# Patient Record
Sex: Female | Born: 1980 | Race: Black or African American | Hispanic: No | Marital: Single | Smoking: Former smoker
Health system: Southern US, Community
[De-identification: ages and names within clinical notes are randomized; demographics above are authoritative.]

## PROBLEM LIST (undated history)

## (undated) DIAGNOSIS — O009 Unspecified ectopic pregnancy without intrauterine pregnancy: Secondary | ICD-10-CM

---

## 2013-06-03 ENCOUNTER — Encounter (HOSPITAL_COMMUNITY): Payer: Self-pay | Admitting: *Deleted

## 2013-06-03 ENCOUNTER — Emergency Department (HOSPITAL_COMMUNITY): Payer: Self-pay

## 2013-06-03 ENCOUNTER — Inpatient Hospital Stay (HOSPITAL_COMMUNITY)
Admission: EM | Admit: 2013-06-03 | Discharge: 2013-06-06 | DRG: 690 | Disposition: A | Discharge status: Home / Self Care | Payer: MEDICAID | Attending: Internal Medicine | Admitting: Internal Medicine

## 2013-06-03 DIAGNOSIS — D509 Iron deficiency anemia, unspecified: Secondary | ICD-10-CM | POA: Diagnosis present

## 2013-06-03 DIAGNOSIS — D500 Iron deficiency anemia secondary to blood loss (chronic): Secondary | ICD-10-CM | POA: Diagnosis present

## 2013-06-03 DIAGNOSIS — N643 Galactorrhea not associated with childbirth: Secondary | ICD-10-CM | POA: Diagnosis present

## 2013-06-03 DIAGNOSIS — I472 Ventricular tachycardia, unspecified: Secondary | ICD-10-CM | POA: Diagnosis present

## 2013-06-03 DIAGNOSIS — D649 Anemia, unspecified: Secondary | ICD-10-CM

## 2013-06-03 DIAGNOSIS — E0781 Sick-euthyroid syndrome: Secondary | ICD-10-CM | POA: Diagnosis present

## 2013-06-03 DIAGNOSIS — N12 Tubulo-interstitial nephritis, not specified as acute or chronic: Principal | ICD-10-CM | POA: Diagnosis present

## 2013-06-03 DIAGNOSIS — N920 Excessive and frequent menstruation with regular cycle: Secondary | ICD-10-CM | POA: Diagnosis present

## 2013-06-03 DIAGNOSIS — R5381 Other malaise: Secondary | ICD-10-CM | POA: Diagnosis present

## 2013-06-03 DIAGNOSIS — R350 Frequency of micturition: Secondary | ICD-10-CM | POA: Diagnosis present

## 2013-06-03 DIAGNOSIS — D62 Acute posthemorrhagic anemia: Secondary | ICD-10-CM | POA: Diagnosis present

## 2013-06-03 DIAGNOSIS — E876 Hypokalemia: Secondary | ICD-10-CM | POA: Diagnosis present

## 2013-06-03 DIAGNOSIS — R7989 Other specified abnormal findings of blood chemistry: Secondary | ICD-10-CM | POA: Diagnosis present

## 2013-06-03 DIAGNOSIS — I4729 Other ventricular tachycardia: Secondary | ICD-10-CM | POA: Diagnosis present

## 2013-06-03 DIAGNOSIS — Z87891 Personal history of nicotine dependence: Secondary | ICD-10-CM

## 2013-06-03 DIAGNOSIS — R112 Nausea with vomiting, unspecified: Secondary | ICD-10-CM | POA: Diagnosis present

## 2013-06-03 HISTORY — DX: Unspecified ectopic pregnancy without intrauterine pregnancy: O00.90

## 2013-06-03 LAB — CBC WITH DIFFERENTIAL/PLATELET
Basophils Relative: 0 % (ref 0–1)
Eosinophils Absolute: 0 10*3/uL (ref 0.0–0.7)
Eosinophils Relative: 0 % (ref 0–5)
HCT: 22.7 % — ABNORMAL LOW (ref 36.0–46.0)
Hemoglobin: 6.2 g/dL — CL (ref 12.0–15.0)
Lymphs Abs: 1 10*3/uL (ref 0.7–4.0)
MCH: 16.8 pg — ABNORMAL LOW (ref 26.0–34.0)
MCHC: 27.3 g/dL — ABNORMAL LOW (ref 30.0–36.0)
MCV: 61.5 fL — ABNORMAL LOW (ref 78.0–100.0)
Monocytes Absolute: 0.9 10*3/uL (ref 0.1–1.0)
Neutro Abs: 8.4 10*3/uL — ABNORMAL HIGH (ref 1.7–7.7)
RBC: 3.69 MIL/uL — ABNORMAL LOW (ref 3.87–5.11)

## 2013-06-03 LAB — COMPREHENSIVE METABOLIC PANEL
Albumin: 3.6 g/dL (ref 3.5–5.2)
Alkaline Phosphatase: 68 U/L (ref 39–117)
BUN: 7 mg/dL (ref 6–23)
CO2: 21 mEq/L (ref 19–32)
Chloride: 97 mEq/L (ref 96–112)
Creatinine, Ser: 0.57 mg/dL (ref 0.50–1.10)
GFR calc non Af Amer: >90 mL/min (ref >90)
Potassium: 3.2 mEq/L — ABNORMAL LOW (ref 3.5–5.1)
Total Bilirubin: 0.8 mg/dL (ref 0.3–1.2)

## 2013-06-03 LAB — URINALYSIS, ROUTINE W REFLEX MICROSCOPIC
Bilirubin Urine: NEGATIVE
Glucose, UA: NEGATIVE mg/dL
Ketones, ur: >80 mg/dL — AB
Nitrite: POSITIVE — AB
Protein, ur: 30 mg/dL — AB
pH: 7.5 (ref 5.0–8.0)

## 2013-06-03 LAB — PREPARE RBC (CROSSMATCH)

## 2013-06-03 LAB — RETICULOCYTES
RBC.: 3.43 MIL/uL — ABNORMAL LOW (ref 3.87–5.11)
Retic Ct Pct: 1.4 % (ref 0.4–3.1)

## 2013-06-03 LAB — LIPASE, BLOOD: Lipase: 14 U/L (ref 11–59)

## 2013-06-03 LAB — POCT PREGNANCY, URINE: Preg Test, Ur: NEGATIVE

## 2013-06-03 LAB — URINE MICROSCOPIC-ADD ON

## 2013-06-03 MED ORDER — FENTANYL CITRATE 0.05 MG/ML IJ SOLN: 50.0000 ug | Freq: Once | INTRAMUSCULAR | Status: DC

## 2013-06-03 MED ORDER — POTASSIUM CHLORIDE CRYS ER 20 MEQ PO TBCR
40.0000 meq | EXTENDED_RELEASE_TABLET | Freq: Once | ORAL | Status: AC
  Administered 2013-06-03: 40 meq via ORAL

## 2013-06-03 MED ORDER — ONDANSETRON HCL 4 MG PO TABS: 4.0000 mg | ORAL_TABLET | Freq: Four times a day (QID) | ORAL | Status: DC | PRN

## 2013-06-03 MED ORDER — ONDANSETRON HCL 4 MG/2ML IJ SOLN
4.0000 mg | Freq: Four times a day (QID) | INTRAMUSCULAR | Status: DC | PRN
  Administered 2013-06-04 – 2013-06-05 (×3): 4 mg via INTRAVENOUS
  Filled 2013-06-03 (×3): qty 2

## 2013-06-03 MED ORDER — SODIUM CHLORIDE 0.9 % IV SOLN
INTRAVENOUS | Status: DC
  Administered 2013-06-04: 02:00:00 via INTRAVENOUS

## 2013-06-03 MED ORDER — DEXTROSE 5 % IV SOLN
1.0000 g | Freq: Once | INTRAVENOUS | Status: AC
  Administered 2013-06-03: 1 g via INTRAVENOUS
  Filled 2013-06-03: qty 10

## 2013-06-03 MED ORDER — HYDROCODONE-ACETAMINOPHEN 5-325 MG PO TABS
1.0000 | ORAL_TABLET | ORAL | Status: DC | PRN
  Filled 2013-06-03 (×2): qty 2

## 2013-06-03 MED ORDER — DEXTROSE 5 % IV SOLN: 1.0000 g | INTRAVENOUS | Status: DC

## 2013-06-03 MED ORDER — ONDANSETRON HCL 4 MG/2ML IJ SOLN
4.0000 mg | Freq: Once | INTRAMUSCULAR | Status: AC
  Administered 2013-06-03: 4 mg via INTRAVENOUS
  Filled 2013-06-03: qty 2

## 2013-06-03 MED ORDER — SODIUM CHLORIDE 0.9 % IV BOLUS (SEPSIS)
1000.0000 mL | Freq: Once | INTRAVENOUS | Status: AC
  Administered 2013-06-03: 1000 mL via INTRAVENOUS

## 2013-06-03 MED ORDER — HYDROMORPHONE HCL PF 1 MG/ML IJ SOLN
1.0000 mg | INTRAMUSCULAR | Status: DC | PRN
  Administered 2013-06-03: 1 mg via INTRAVENOUS
  Filled 2013-06-03 (×2): qty 1

## 2013-06-03 MED ORDER — DEXTROSE 5 % IV SOLN
1.0000 g | INTRAVENOUS | Status: DC
  Administered 2013-06-04: 1 g via INTRAVENOUS
  Filled 2013-06-03 (×2): qty 10

## 2013-06-03 MED ORDER — IOHEXOL 300 MG/ML  SOLN
100.0000 mL | Freq: Once | INTRAMUSCULAR | Status: AC | PRN
  Administered 2013-06-03: 80 mL via INTRAVENOUS

## 2013-06-03 MED ORDER — SODIUM CHLORIDE 0.9 % IJ SOLN
3.0000 mL | Freq: Two times a day (BID) | INTRAMUSCULAR | Status: DC
  Administered 2013-06-03 – 2013-06-06 (×2): 3 mL via INTRAVENOUS

## 2013-06-03 MED ORDER — FENTANYL CITRATE 0.05 MG/ML IJ SOLN
50.0000 ug | Freq: Once | INTRAMUSCULAR | Status: AC
  Administered 2013-06-03: 50 ug via INTRAVENOUS
  Filled 2013-06-03: qty 2

## 2013-06-03 MED ORDER — IOHEXOL 300 MG/ML  SOLN
50.0000 mL | Freq: Once | INTRAMUSCULAR | Status: AC | PRN
  Administered 2013-06-03: 50 mL via INTRAVENOUS

## 2013-06-03 NOTE — H&P (Addendum)
[facility] History and Physical  Marcia Richardson ZOX:[id number] DOB: 08/21/1981 DOA: 06/03/2013  Referring physician: ED physician PCP: No primary provider on file.   Chief Complaint: abdominal pain   HPI:  Pt is 32 yo female with no significant PMH, presents to [facility] [place] with main concern of progressively worsening left side flank pain  That initially started 5 days prior to this admission, associated with dysuria and urinary urgency and frequency, subjective fevers, chills, poor oral intake, nausea and non bloody vomiting. Pt explains that pain is intermittent and colicky, 7/10 in severity when present, no similar events in the past. She reports pain radiates to entire abdominal area. Pt reports generalized weakness but says its chronic, no specific focal neurological symptoms.   In ED, CT abdomen and pelvis with left pyelonephritis, Hg 6.2 of unclear etiology. 2 units of PRBC ordered and pt given one dose of Rocephin. TRH asked to admit for further management.   Assessment and Plan:  Principal Problem:   Pyelonephritis - also evident on CT abdomen and pelvis - pt has received one dose of Rocephin in ED and will continue inpatient - will need to follow up on urine culture  - provide supportive care with analgesia and antiemetics as needed Active Problems:   Acute blood loss anemia - microcytic, unclear etiology - will check anemia panel, FOBT - agree with transfusing two units of PRBC - repeat CBC in AM   Hypokalemia - mild, will supplement and will check Mg level - BMP in AM  Code Status: Full Family Communication: Pt at bedside Disposition Plan: Admit to telemetry bed due to tachycardia   Review of Systems:  Constitutional: Positive for fever, chills and malaise/fatigue. Negative for diaphoresis.  HENT: Negative for hearing loss, ear pain, nosebleeds, congestion, sore throat, neck pain, tinnitus and ear discharge.   Eyes: Negative for blurred vision, double vision,  photophobia, pain, discharge and redness.  Respiratory: Negative for cough, hemoptysis, sputum production, shortness of breath, wheezing and stridor.   Cardiovascular: Negative for chest pain, palpitations, orthopnea, claudication and leg swelling.  Gastrointestinal: Positive for nausea, vomiting and abdominal pain. Negative for heartburn, constipation, blood in stool and melena.  Genitourinary: Positive for dysuria, urgency, frequency, flank pain.  Musculoskeletal: Negative for myalgias, back pain, joint pain and falls.  Skin: Negative for itching and rash.  Neurological: Negative for tingling, tremors, sensory change, speech change, focal weakness, loss of consciousness and headaches.  Endo/Heme/Allergies: Negative for environmental allergies and polydipsia. Does not bruise/bleed easily.  Psychiatric/Behavioral: Negative for suicidal ideas. The patient is not nervous/anxious.      Past Medical History  Diagnosis Date  . Ectopic pregnancy     2007    History reviewed. No pertinent past surgical history.  Social History:  reports that she quit smoking 6 days ago. She has never used smokeless tobacco. She reports that she does not drink alcohol or use illicit drugs.  No Known Allergies  No family history of cancers   Prior to Admission medications   Not on File    Physical Exam: Filed Vitals:   06/03/13 1844 06/03/13 1847 06/03/13 2116  BP:  119/60 112/63  Pulse:  104 92  Temp:  100.9 F (38.3 C)   TempSrc:  Oral   Resp:  16 15  SpO2: 99% 100% 99%    Physical Exam  Constitutional: Appears well-developed and well-nourished. No distress.  HENT: Normocephalic. External right and left ear normal. Oropharynx is clear and moist.  Eyes: Conjunctivae and  EOM are normal. PERRLA, no scleral icterus.  Neck: Normal ROM. Neck supple. No JVD. No tracheal deviation. No thyromegaly.  CVS: RRR, S1/S2 +, no murmurs, no gallops, no carotid bruit.  Pulmonary: Effort and breath sounds  normal, no stridor, rhonchi, wheezes, rales.  Abdominal: Soft. BS +,  no distension, tenderness in epigastrica area and left flank area, no rebound or guarding.  Musculoskeletal: Normal range of motion. No edema and no tenderness.  Lymphadenopathy: No lymphadenopathy noted, cervical, inguinal. Neuro: Alert. Normal reflexes, muscle tone coordination. No cranial nerve deficit. Skin: Skin is warm and dry. No rash noted. Not diaphoretic. No erythema. No pallor.  Psychiatric: Normal mood and affect. Behavior, judgment, thought content normal.   Labs on Admission:  Basic Metabolic Panel:  Recent Labs Lab 06/03/13 1909  NA 134*  K 3.2*  CL 97  CO2 21  GLUCOSE 88  BUN 7  CREATININE 0.57  CALCIUM 9.3   Liver Function Tests:  Recent Labs Lab 06/03/13 1909  AST 12  ALT 6  ALKPHOS 68  BILITOT 0.8  PROT 7.6  ALBUMIN 3.6    Recent Labs Lab 06/03/13 1909  LIPASE 14   No results found for this basename: AMMONIA,  in the last 168 hours CBC:  Recent Labs Lab 06/03/13 1909  WBC 10.3  NEUTROABS 8.4*  HGB 6.2*  HCT 22.7*  MCV 61.5*  PLT 258   Radiological Exams on Admission: Ct Abdomen Pelvis W Contrast 06/03/2013    1.  Focal areas of diminished perfusion in the left kidney suggestive of pyelonephritis.  No hydronephrosis or abscess is identified.  2.  Bilateral adnexal cysts without obvious evidence of rupture by CT.     EKG: Normal sinus rhythm, no ST/T wave changes  Debbora Presto, MD  [facility] Pager [phone]  If 7PM-7AM, please contact night-coverage [email or web address] Password [facility] 06/03/2013, 9:26 PM

## 2013-06-03 NOTE — ED Notes (Signed)
Pt reports abdominal pain x4 days, now radiates to back and down right leg. Vomited on Friday. Did not vomit en route. Pt has no medical hx except ectopic pregnancy 7 years.

## 2013-06-03 NOTE — Progress Notes (Signed)
Patient states she does not feel like signing up for My Chart at current time. Briscoe Burns BSN, RN-BC Admissions RN  06/03/2013 9:21 PM

## 2013-06-03 NOTE — ED Provider Notes (Signed)
CSN: [id number]     Arrival date & time 06/03/13  1844 History     First MD Initiated Contact with Patient 06/03/13 1922     Chief Complaint  Patient presents with  . Abdominal Pain  . Back Pain   (Consider location/radiation/quality/duration/timing/severity/associated sxs/prior Treatment) HPI Comments: Patient presents with left-sided abdominal pain that started about 5 days ago. She states started in her left lower back and now radiates across her abdomen. She's had some nausea and vomiting. She denies any known fevers. She denies any vaginal bleeding or discharge. She's had a history of an ectopic pregnancy in the past about 7 years ago. She states the pain feels similar to that. Spent a constant throbbing pain which gradually gotten worse. She does have some urinary frequency. She's also been feeling dizzy and may headache.    Past Medical History  Diagnosis Date  . Ectopic pregnancy     2007   History reviewed. No pertinent past surgical history. History reviewed. No pertinent family history. History  Substance Use Topics  . Smoking status: Not on file  . Smokeless tobacco: Not on file  . Alcohol Use: Not on file   OB History   Grav Para Term Preterm Abortions TAB SAB Ect Mult Living                 Review of Systems  Constitutional: Positive for fever and fatigue. Negative for chills and diaphoresis.  HENT: Negative for congestion, rhinorrhea and sneezing.   Eyes: Negative.   Respiratory: Negative for cough, chest tightness and shortness of breath.   Cardiovascular: Negative for chest pain and leg swelling.  Gastrointestinal: Positive for nausea, vomiting and abdominal pain. Negative for diarrhea and blood in stool.  Genitourinary: Positive for frequency. Negative for hematuria, flank pain, vaginal bleeding, vaginal discharge and difficulty urinating.  Musculoskeletal: Positive for back pain. Negative for arthralgias.  Skin: Negative for rash.  Neurological: Positive  for weakness (generalized). Negative for dizziness, speech difficulty, numbness and headaches.    Allergies  Review of patient's allergies indicates no known allergies.  Home Medications  No current outpatient prescriptions on file. BP 119/60  Pulse 104  Temp(Src) 100.9 F (38.3 C) (Oral)  Resp 16  SpO2 100%  LMP 04/21/2013 Physical Exam  Constitutional: She is oriented to person, place, and time. She appears well-developed and well-nourished.  HENT:  Head: Normocephalic and atraumatic.  Mouth/Throat: Oropharynx is clear and moist.  Eyes: Pupils are equal, round, and reactive to light.  Neck: Normal range of motion. Neck supple.  Cardiovascular: Normal rate, regular rhythm and normal heart sounds.   Pulmonary/Chest: Effort normal and breath sounds normal. No respiratory distress. She has no wheezes. She has no rales. She exhibits no tenderness.  Abdominal: Soft. Bowel sounds are normal. There is tenderness (Marked tenderness to the lower abdomen. More on the left side. She has voluntary guarding). There is no rebound and no guarding.  +CVA tenderness  Musculoskeletal: Normal range of motion. She exhibits no edema.  Lymphadenopathy:    She has no cervical adenopathy.  Neurological: She is alert and oriented to person, place, and time.  Skin: Skin is warm and dry. No rash noted.  Psychiatric: She has a normal mood and affect.    ED Course   Procedures (including critical care time)  Results for orders placed during the hospital encounter of 06/03/13  CBC WITH DIFFERENTIAL      Result Value Range   WBC 10.3  4.0 - 10.5  K/uL   RBC 3.69 (*) 3.87 - 5.11 MIL/uL   Hemoglobin 6.2 (*) 12.0 - 15.0 g/dL   HCT 16.1 (*) 09.6 - 04.5 %   MCV 61.5 (*) 78.0 - 100.0 fL   MCH 16.8 (*) 26.0 - 34.0 pg   MCHC 27.3 (*) 30.0 - 36.0 g/dL   RDW 40.9 (*) 81.1 - 91.4 %   Platelets 258  150 - 400 K/uL   Neutrophils Relative % 81 (*) 43 - 77 %   Lymphocytes Relative 10 (*) 12 - 46 %   Monocytes  Relative 9  3 - 12 %   Eosinophils Relative 0  0 - 5 %   Basophils Relative 0  0 - 1 %   Neutro Abs 8.4 (*) 1.7 - 7.7 K/uL   Lymphs Abs 1.0  0.7 - 4.0 K/uL   Monocytes Absolute 0.9  0.1 - 1.0 K/uL   Eosinophils Absolute 0.0  0.0 - 0.7 K/uL   Basophils Absolute 0.0  0.0 - 0.1 K/uL   RBC Morphology POLYCHROMASIA PRESENT     WBC Morphology MILD LEFT SHIFT (1-5% METAS, OCC MYELO, OCC BANDS)     Smear Review LARGE PLATELETS PRESENT    COMPREHENSIVE METABOLIC PANEL      Result Value Range   Sodium 134 (*) 135 - 145 mEq/L   Potassium 3.2 (*) 3.5 - 5.1 mEq/L   Chloride 97  96 - 112 mEq/L   CO2 21  19 - 32 mEq/L   Glucose, Bld 88  70 - 99 mg/dL   BUN 7  6 - 23 mg/dL   Creatinine, Ser 7.82  0.50 - 1.10 mg/dL   Calcium 9.3  8.4 - 95.6 mg/dL   Total Protein 7.6  6.0 - 8.3 g/dL   Albumin 3.6  3.5 - 5.2 g/dL   AST 12  0 - 37 U/L   ALT 6  0 - 35 U/L   Alkaline Phosphatase 68  39 - 117 U/L   Total Bilirubin 0.8  0.3 - 1.2 mg/dL   GFR calc non Af Amer >90  >90 mL/min   GFR calc Af Amer >90  >90 mL/min  LIPASE, BLOOD      Result Value Range   Lipase 14  11 - 59 U/L  URINALYSIS, ROUTINE W REFLEX MICROSCOPIC      Result Value Range   Color, Urine YELLOW  YELLOW   APPearance CLOUDY (*) CLEAR   Specific Gravity, Urine 1.019  1.005 - 1.030   pH 7.5  5.0 - 8.0   Glucose, UA NEGATIVE  NEGATIVE mg/dL   Hgb urine dipstick SMALL (*) NEGATIVE   Bilirubin Urine NEGATIVE  NEGATIVE   Ketones, ur >80 (*) NEGATIVE mg/dL   Protein, ur 30 (*) NEGATIVE mg/dL   Urobilinogen, UA 4.0 (*) 0.0 - 1.0 mg/dL   Nitrite POSITIVE (*) NEGATIVE   Leukocytes, UA LARGE (*) NEGATIVE  HCG, QUANTITATIVE, PREGNANCY      Result Value Range   hCG, Beta Chain, Quant, S <1  <5 mIU/mL  URINE MICROSCOPIC-ADD ON      Result Value Range   Squamous Epithelial / LPF FEW (*) RARE   WBC, UA 11-20  <3 WBC/hpf   RBC / HPF 0-2  <3 RBC/hpf   Bacteria, UA MANY (*) RARE   Urine-Other MUCOUS PRESENT    POCT PREGNANCY, URINE       Result Value Range   Preg Test, Ur NEGATIVE  NEGATIVE  TYPE AND SCREEN  Result Value Range   ABO/RH(D) O NEG     Antibody Screen NEG     Sample Expiration 06/06/2013     Unit Number [id number]     Blood Component Type RED CELLS,LR     Unit division 00     Status of Unit ALLOCATED     Transfusion Status OK TO TRANSFUSE     Crossmatch Result Compatible     Unit Number [id number]     Blood Component Type RED CELLS,LR     Unit division 00     Status of Unit ALLOCATED     Transfusion Status OK TO TRANSFUSE     Crossmatch Result Compatible    PREPARE RBC (CROSSMATCH)      Result Value Range   Order Confirmation ORDER PROCESSED BY BLOOD BANK    ABO/RH      Result Value Range   ABO/RH(D) O NEG     Ct Abdomen Pelvis W Contrast  06/03/2013   *RADIOLOGY REPORT*  Clinical Data: Lower quadrant abdominal pain and 18 units.  CT ABDOMEN AND PELVIS WITH CONTRAST  Technique:  Multidetector CT imaging of the abdomen and pelvis was performed following the standard protocol during bolus administration of intravenous contrast.  Contrast: 80mL OMNIPAQUE IOHEXOL 300 MG/ML  SOLN, 50mL OMNIPAQUE IOHEXOL 300 MG/ML  SOLN  Comparison: None.  Findings: The liver, gallbladder, pancreas, spleen and adrenal glands are within normal limits.  Within the left kidney, there are two focal areas of diminished perfusion identified at the anterior upper pole and the lateral mid kidney.  These are well appreciated on coronal reconstructions.  There may also be another subtle area of diminished perfusion in the lower pole of the left kidney. These findings may be reflective of pyelonephritis and correlation is suggested with urinalysis and location of pain.  No hydronephrosis, renal abscess or renal calculi are identified.  Bowel loops are normal caliber show no evidence of obstruction or inflammation.  A no free fluid, abnormal fluid collection or free intraperitoneal air is identified.  In the pelvis, bilateral adnexal  regions show prominent cyst.  On the right side, the unilocular cyst measures approximately 3.4 x 4.2 cm.  On the left side, single cyst measures 3.2 cm in greatest diameter.  No free fluid is seen in the pelvis.  The bladder is unremarkable.  No masses or enlarged lymph nodes are seen.  No vascular abnormalities are identified.  No evidence of hernia.  Bony structures are within normal limits.  IMPRESSION:  1.  Focal areas of diminished perfusion in the left kidney suggestive of pyelonephritis.  No hydronephrosis or abscess is identified.  Correlation suggested with urinalysis and location of the patient's pain. 2.  Bilateral adnexal cysts without obvious evidence of rupture by CT.   Original Report Authenticated By: Irish Lack, M.D.      Ct Abdomen Pelvis W Contrast  06/03/2013   *RADIOLOGY REPORT*  Clinical Data: Lower quadrant abdominal pain and 18 units.  CT ABDOMEN AND PELVIS WITH CONTRAST  Technique:  Multidetector CT imaging of the abdomen and pelvis was performed following the standard protocol during bolus administration of intravenous contrast.  Contrast: 80mL OMNIPAQUE IOHEXOL 300 MG/ML  SOLN, 50mL OMNIPAQUE IOHEXOL 300 MG/ML  SOLN  Comparison: None.  Findings: The liver, gallbladder, pancreas, spleen and adrenal glands are within normal limits.  Within the left kidney, there are two focal areas of diminished perfusion identified at the anterior upper pole and the lateral mid kidney.  These are  well appreciated on coronal reconstructions.  There may also be another subtle area of diminished perfusion in the lower pole of the left kidney. These findings may be reflective of pyelonephritis and correlation is suggested with urinalysis and location of pain.  No hydronephrosis, renal abscess or renal calculi are identified.  Bowel loops are normal caliber show no evidence of obstruction or inflammation.  A no free fluid, abnormal fluid collection or free intraperitoneal air is identified.  In the  pelvis, bilateral adnexal regions show prominent cyst.  On the right side, the unilocular cyst measures approximately 3.4 x 4.2 cm.  On the left side, single cyst measures 3.2 cm in greatest diameter.  No free fluid is seen in the pelvis.  The bladder is unremarkable.  No masses or enlarged lymph nodes are seen.  No vascular abnormalities are identified.  No evidence of hernia.  Bony structures are within normal limits.  IMPRESSION:  1.  Focal areas of diminished perfusion in the left kidney suggestive of pyelonephritis.  No hydronephrosis or abscess is identified.  Correlation suggested with urinalysis and location of the patient's pain. 2.  Bilateral adnexal cysts without obvious evidence of rupture by CT.   Original Report Authenticated By: Irish Lack, M.D.   1. Pyelonephritis   2. Anemia     MDM  Pt with marked abdominal tenderness. Initially I was concerned about a possible ruptured ectopic given her marked anemia. Her pregnancy test however his combat negative. She was given IV pain medication and fluids. Her CT scan shows a likely pyelonephritis which is consistent with exam. Given her marked anemia which is symptomatic I did go ahead and type and cross her for 2 units of packed red cells and I will consult hospitalist for admission. She was given Rocephin in the ED.     Rolan Bucco, MD 06/03/13 2100

## 2013-06-03 NOTE — ED Notes (Signed)
Pt states she has pain in her sides, back and chest since Friday. Pt states she used Icy-Hot to her sore areas and it helped. Pt states pain is sharp and constant. Rates pain 10/10. Pt a/o x 4. Pt with no acute distress. Skin warm and dry.

## 2013-06-04 ENCOUNTER — Inpatient Hospital Stay (HOSPITAL_COMMUNITY): Payer: MEDICAID

## 2013-06-04 DIAGNOSIS — N643 Galactorrhea not associated with childbirth: Secondary | ICD-10-CM | POA: Diagnosis present

## 2013-06-04 DIAGNOSIS — R7989 Other specified abnormal findings of blood chemistry: Secondary | ICD-10-CM | POA: Diagnosis present

## 2013-06-04 LAB — CBC
HCT: 27.7 % — ABNORMAL LOW (ref 36.0–46.0)
Hemoglobin: 8 g/dL — ABNORMAL LOW (ref 12.0–15.0)
MCH: 19.4 pg — ABNORMAL LOW (ref 26.0–34.0)
MCV: 67.1 fL — ABNORMAL LOW (ref 78.0–100.0)
RBC: 4.13 MIL/uL (ref 3.87–5.11)

## 2013-06-04 LAB — BASIC METABOLIC PANEL
CO2: 24 mEq/L (ref 19–32)
Glucose, Bld: 98 mg/dL (ref 70–99)
Potassium: 3.2 mEq/L — ABNORMAL LOW (ref 3.5–5.1)
Sodium: 133 mEq/L — ABNORMAL LOW (ref 135–145)

## 2013-06-04 LAB — TSH: TSH: 0.128 u[IU]/mL — ABNORMAL LOW (ref 0.350–4.500)

## 2013-06-04 LAB — PROLACTIN: Prolactin: 13.9 ng/mL

## 2013-06-04 LAB — T4, FREE: Free T4: 1.13 ng/dL (ref 0.80–1.80)

## 2013-06-04 LAB — IRON AND TIBC

## 2013-06-04 LAB — VITAMIN B12: Vitamin B-12: 515 pg/mL (ref 211–911)

## 2013-06-04 MED ORDER — HYDROMORPHONE HCL PF 2 MG/ML IJ SOLN
2.0000 mg | INTRAMUSCULAR | Status: DC | PRN
  Administered 2013-06-04: 2 mg via INTRAVENOUS
  Filled 2013-06-04: qty 1

## 2013-06-04 MED ORDER — KETOROLAC TROMETHAMINE 30 MG/ML IJ SOLN
30.0000 mg | Freq: Four times a day (QID) | INTRAMUSCULAR | Status: DC | PRN
  Administered 2013-06-06: 30 mg via INTRAVENOUS
  Filled 2013-06-04: qty 1

## 2013-06-04 MED ORDER — PROMETHAZINE HCL 25 MG/ML IJ SOLN: 25.0000 mg | INTRAMUSCULAR | Status: DC | PRN

## 2013-06-04 MED ORDER — VITAMINS A & D EX OINT
TOPICAL_OINTMENT | CUTANEOUS | Status: AC
  Administered 2013-06-04: 1
  Filled 2013-06-04: qty 5

## 2013-06-04 MED ORDER — HYDROMORPHONE HCL PF 1 MG/ML IJ SOLN
1.0000 mg | INTRAMUSCULAR | Status: DC | PRN
  Administered 2013-06-04 – 2013-06-05 (×4): 1 mg via INTRAVENOUS
  Filled 2013-06-04 (×4): qty 1

## 2013-06-04 MED ORDER — POTASSIUM CHLORIDE IN NACL 20-0.9 MEQ/L-% IV SOLN
INTRAVENOUS | Status: DC
  Administered 2013-06-04: 11:00:00 via INTRAVENOUS
  Administered 2013-06-05: 1000 mL via INTRAVENOUS
  Administered 2013-06-06: 08:00:00 via INTRAVENOUS
  Filled 2013-06-04 (×5): qty 1000

## 2013-06-04 MED ORDER — ACETAMINOPHEN 325 MG PO TABS
650.0000 mg | ORAL_TABLET | ORAL | Status: DC | PRN
  Administered 2013-06-04 (×2): 650 mg via ORAL
  Filled 2013-06-04 (×2): qty 2

## 2013-06-04 MED ORDER — HYDROMORPHONE HCL PF 1 MG/ML IJ SOLN
INTRAMUSCULAR | Status: AC
  Administered 2013-06-04: 2 mg
  Filled 2013-06-04: qty 1

## 2013-06-04 MED ORDER — BOOST / RESOURCE BREEZE PO LIQD
1.0000 | Freq: Two times a day (BID) | ORAL | Status: DC
  Administered 2013-06-04 – 2013-06-06 (×3): 1 via ORAL

## 2013-06-04 MED ORDER — ADULT MULTIVITAMIN W/MINERALS CH
1.0000 | ORAL_TABLET | Freq: Every day | ORAL | Status: DC
  Administered 2013-06-04 – 2013-06-06 (×3): 1 via ORAL
  Filled 2013-06-04 (×3): qty 1

## 2013-06-04 NOTE — Progress Notes (Signed)
Assumed care of patient. Condition stable. No change in previous RN  assessment at this time. Continue with plan of care

## 2013-06-04 NOTE — Progress Notes (Signed)
[facility] PROGRESS NOTE  Marcia Richardson AVW:[id number] DOB: December 15, 1980 DOA: 06/03/2013 PCP: No primary provider on file.  Brief narrative: Marcia Richardson is an 32 y.o. female with no significant PMH who was admitted on 06/03/2013 with pyelonephritis and incidentally discovered microcytic anemia with a presenting hemoglobin of 6.2 mg/dL.  Assessment/Plan: Principal Problem:   Pyelonephritis -Continue empiric Rocephin. -Followup urine culture results. -Continue supportive care with analgesia and antiemetics as needed. Active Problems:   Galactorrhea -Check prolactin level.  UPT done on admission negative.   Low TSH -Check free T4.   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 -Anemia profile consistent with iron deficiency. -Transvaginal ultrasound ordered.   Hypokalemia -Supplement. Magnesium levels okay.   Code Status: Full. Family Communication: No family at bedside. Disposition Plan: Home when stable.   Medical Consultants:  None.  Other Consultants:  None.  Anti-infectives: Rocephin 06/03/2013--->  HPI/Subjective: Marcia Richardson complains of bilateral milky nipple discharge for 1 week.  Not on any routine medicines at home.  Complains of back pain and nausea.  Endorses heavy periods, bleeds heavily and passes clots for 6 days each menstrual cycle.  Objective: Filed Vitals:   06/04/13 0404 06/04/13 0504 06/04/13 0604 06/04/13 0632  BP: 117/64 97/58 117/82 96/60  Pulse: 78 104 84 88  Temp: 97.9 F (36.6 C) 98.1 F (36.7 C) 97.7 F (36.5 C) 98.5 F (36.9 C)  TempSrc: Oral Oral Oral Oral  Resp: 18 18 18 20   Height:      Weight:      SpO2:        Intake/Output Summary (Last 24 hours) at 06/04/13 0750 Last data filed at 06/04/13 8295  Gross per 24 hour  Intake    425 ml  Output      0 ml  Net    425 ml    Exam: Gen:  Tearful. Cardiovascular:  RRR, No M/R/G Respiratory:   Lungs CTAB Gastrointestinal:  Abdomen soft, NT/ND, + BS Extremities:  No C/E/C  Data Reviewed: Basic Metabolic Panel:  Recent Labs Lab 06/03/13 1909 06/03/13 2145  NA 134*  --   K 3.2*  --   CL 97  --   CO2 21  --   GLUCOSE 88  --   BUN 7  --   CREATININE 0.57  --   CALCIUM 9.3  --   MG  --  2.0   GFR Estimated Creatinine Clearance: 68.2 ml/min (by C-G formula based on Cr of 0.57). Liver Function Tests:  Recent Labs Lab 06/03/13 1909  AST 12  ALT 6  ALKPHOS 68  BILITOT 0.8  PROT 7.6  ALBUMIN 3.6    Recent Labs Lab 06/03/13 1909  LIPASE 14   CBC:  Recent Labs Lab 06/03/13 1909  WBC 10.3  NEUTROABS 8.4*  HGB 6.2*  HCT 22.7*  MCV 61.5*  PLT 258   Thyroid function studies  Recent Labs  06/03/13 2145  TSH 0.128*   Anemia work up  Recent Labs  06/03/13 2145  VITAMINB12 515  FOLATE 13.7  FERRITIN 7*  TIBC Not calculated due to Iron <10.  IRON <10*  RETICCTPCT 1.4   Microbiology No results found for this or any previous visit (from the past 240 hour(s)).   Procedures and Diagnostic Studies: Ct Abdomen Pelvis W Contrast  06/03/2013   *RADIOLOGY REPORT*  Clinical Data: Lower quadrant abdominal pain and 18 units.  CT ABDOMEN AND PELVIS WITH CONTRAST  Technique:  Multidetector CT imaging of the abdomen and pelvis was performed following the standard protocol during bolus administration of intravenous contrast.  Contrast: 80mL OMNIPAQUE IOHEXOL 300 MG/ML  SOLN, 50mL OMNIPAQUE IOHEXOL 300 MG/ML  SOLN  Comparison: None.  Findings: The liver, gallbladder, pancreas, spleen and adrenal glands are within normal limits.  Within the left kidney, there are two focal areas of diminished perfusion identified at the anterior upper pole and the lateral mid kidney.  These are well appreciated on coronal reconstructions.  There may also be another subtle area of diminished perfusion in the lower pole of the left kidney. These findings may be reflective of  pyelonephritis and correlation is suggested with urinalysis and location of pain.  No hydronephrosis, renal abscess or renal calculi are identified.  Bowel loops are normal caliber show no evidence of obstruction or inflammation.  A no free fluid, abnormal fluid collection or free intraperitoneal air is identified.  In the pelvis, bilateral adnexal regions show prominent cyst.  On the right side, the unilocular cyst measures approximately 3.4 x 4.2 cm.  On the left side, single cyst measures 3.2 cm in greatest diameter.  No free fluid is seen in the pelvis.  The bladder is unremarkable.  No masses or enlarged lymph nodes are seen.  No vascular abnormalities are identified.  No evidence of hernia.  Bony structures are within normal limits.  IMPRESSION:  1.  Focal areas of diminished perfusion in the left kidney suggestive of pyelonephritis.  No hydronephrosis or abscess is identified.  Correlation suggested with urinalysis and location of the patient's pain. 2.  Bilateral adnexal cysts without obvious evidence of rupture by CT.   Original Report Authenticated By: Irish Lack, M.D.    Scheduled Meds: . cefTRIAXone (ROCEPHIN)  IV  1 g Intravenous Q24H  . sodium chloride  3 mL Intravenous Q12H   Continuous Infusions: . sodium chloride 50 mL/hr at 06/04/13 0228    Time spent: 35 minutes with > 50% of time spent ascertaining/clarifying PMH and HPI, discussing current diagnostic test results and clinical impression as well as plan of care.   LOS: 1 day   RAMA,CHRISTINA  [facility] Pager [phone].   *Please note that the hospitalists switch teams on Wednesdays. Please call the flow manager at [phone] if you are having difficulty reaching the hospitalist taking care of this patient as she can update you and provide the most up-to-date pager number of provider caring for the patient. If 8PM-8AM, please contact night-coverage at [email or web address], password [email or web address]  06/04/2013, 7:50 AM

## 2013-06-04 NOTE — Progress Notes (Signed)
INITIAL NUTRITION ASSESSMENT  DOCUMENTATION CODES Per approved criteria  -Not Applicable   INTERVENTION: Provide Resource Breeze BID until appetite improves Provide Multivitamin with minerals daily  NUTRITION DIAGNOSIS: Inadequate oral intake related to poor appetite/pain as evidenced by 10% wt loss in the past few months per pt's report.   Goal: Pt to meet >/= 90% of their estimated nutrition needs   Monitor:  PO intake Weight Labs  Reason for Assessment: Malnutrition Screening Tool, score of 3  32 y.o. female  Admitting Dx: Pyelonephritis  ASSESSMENT: 32 yo female with no significant PMH, presents to [facility] ED with main concern of progressively worsening left side flank pain That initially started 5 days prior to this admission, associated with dysuria and urinary urgency and frequency, subjective fevers, chills, poor oral intake, nausea and non bloody vomiting.   Pt reports that she hasn't been able to eat for the past 2 days but, she was eating well before. Pt states that today she has only had some clear liquids and jello and has vomited due to pain and nausea. Pt reports that she usually weighs 125 lbs which she weighed back in the Spring; she has lost weigh to due pain and decreased appetite.   Height: Ht Readings from Last 1 Encounters:  06/03/13 4\' 11"  (1.499 m)    Weight: Wt Readings from Last 1 Encounters:  06/03/13 112 lb 14 oz (51.2 kg)    Ideal Body Weight: 98 lbs  % Ideal Body Weight: 114%  Wt Readings from Last 10 Encounters:  06/03/13 112 lb 14 oz (51.2 kg)    Usual Body Weight: 125 lbs  % Usual Body Weight: 90%  BMI:  Body mass index is 22.79 kg/(m^2).  Estimated Nutritional Needs: Kcal: 1430-1640 Protein: 50-60 grams Fluid: >/= 1.9 L  Skin: WDL  Diet Order: General  EDUCATION NEEDS: -No education needs identified at this time   Intake/Output Summary (Last 24 hours) at 06/04/13 1627 Last data filed at 06/04/13 1422  Gross per 24  hour  Intake 847.92 ml  Output      1 ml  Net 846.92 ml    Last BM: 7/30   Labs:   Recent Labs Lab 06/03/13 1909 06/03/13 2145 06/04/13 0830  NA 134*  --  133*  K 3.2*  --  3.2*  CL 97  --  96  CO2 21  --  24  BUN 7  --  4*  CREATININE 0.57  --  0.56  CALCIUM 9.3  --  8.9  MG  --  2.0  --   GLUCOSE 88  --  98    CBG (last 3)  No results found for this basename: GLUCAP,  in the last 72 hours  Scheduled Meds: . cefTRIAXone (ROCEPHIN)  IV  1 g Intravenous Q24H  . sodium chloride  3 mL Intravenous Q12H    Continuous Infusions: . 0.9 % NaCl with KCl 20 mEq / L 50 mL/hr at 06/04/13 1039    Past Medical History  Diagnosis Date  . Ectopic pregnancy     2007    History reviewed. No pertinent past surgical history.  Ian Malkin RD, LDN Inpatient Clinical Dietitian Pager: [phone] After Hours Pager: [phone]

## 2013-06-04 NOTE — Progress Notes (Signed)
MD notified og temp 101.2 pre blood vital signs. Orders given. Will start blood 30 mmins post tylenol

## 2013-06-05 ENCOUNTER — Other Ambulatory Visit (HOSPITAL_COMMUNITY): Payer: Self-pay

## 2013-06-05 LAB — TYPE AND SCREEN
Antibody Screen: NEGATIVE
Unit division: 0

## 2013-06-05 LAB — URINE CULTURE: Colony Count: >=100000

## 2013-06-05 LAB — CBC
MCH: 21.1 pg — ABNORMAL LOW (ref 26.0–34.0)
MCHC: 30.5 g/dL (ref 30.0–36.0)
Platelets: 215 10*3/uL (ref 150–400)

## 2013-06-05 LAB — BASIC METABOLIC PANEL
BUN: <3 mg/dL — ABNORMAL LOW (ref 6–23)
CO2: 23 mEq/L (ref 19–32)
Calcium: 8.9 mg/dL (ref 8.4–10.5)
Chloride: 100 mEq/L (ref 96–112)
GFR calc non Af Amer: >90 mL/min (ref >90)
Glucose, Bld: 92 mg/dL (ref 70–99)
Potassium: 3.1 mEq/L — ABNORMAL LOW (ref 3.5–5.1)

## 2013-06-05 LAB — RAPID URINE DRUG SCREEN, HOSP PERFORMED
Amphetamines: NOT DETECTED
Barbiturates: NOT DETECTED
Benzodiazepines: NOT DETECTED
Tetrahydrocannabinol: POSITIVE — AB

## 2013-06-05 MED ORDER — VITAMINS A & D EX OINT
TOPICAL_OINTMENT | CUTANEOUS | Status: AC
  Administered 2013-06-05: 5
  Filled 2013-06-05: qty 5

## 2013-06-05 MED ORDER — CIPROFLOXACIN HCL 500 MG PO TABS
500.0000 mg | ORAL_TABLET | Freq: Two times a day (BID) | ORAL | Status: DC
  Administered 2013-06-05 – 2013-06-06 (×2): 500 mg via ORAL
  Filled 2013-06-05 (×5): qty 1

## 2013-06-05 MED ORDER — FERROUS SULFATE 325 (65 FE) MG PO TABS
325.0000 mg | ORAL_TABLET | Freq: Two times a day (BID) | ORAL | Status: DC
  Administered 2013-06-05 – 2013-06-06 (×2): 325 mg via ORAL
  Filled 2013-06-05 (×4): qty 1

## 2013-06-05 MED ORDER — POTASSIUM CHLORIDE CRYS ER 20 MEQ PO TBCR
20.0000 meq | EXTENDED_RELEASE_TABLET | Freq: Two times a day (BID) | ORAL | Status: DC
  Administered 2013-06-05 – 2013-06-06 (×3): 20 meq via ORAL
  Filled 2013-06-05 (×4): qty 1

## 2013-06-05 NOTE — Progress Notes (Signed)
Patient c/o chest pain.  Patient stated pain was sharp and worse with deep breaths.  Vital signs stable, heart rate adn rhythm stable on monitor, and patient displayed no symptoms of acute distress, no SOB, no diaphoresis.  EKG performed, patient slightly bradycardic but otherwise normal EKG.  Patient placed on 2L O2 via nasal cannula as a precaution, but O2 sats on room air were 100%.  Patient refused available pain medication (vicodin) to attempt to relieve pain in chest.  Dr. Darnelle Catalan notified; no new orders given.  Will continue to monitor patient.

## 2013-06-05 NOTE — Progress Notes (Signed)
Spoke with pt concerning discharge needs, medications and PCP. Explained [facility] to pt and [facility] program to pt. Pt states that she will not come down here to [place] and pay to see a doctor, in [place] she never pay to see a doctor, that she could get her friend to pay for her medications. Pt continued to say that she will go back home to [place].

## 2013-06-05 NOTE — Progress Notes (Signed)
Pt refused MRI scan today. Paged Dr. Darnelle Catalan. Dr. Darnelle Catalan said that if she was not able to do MRI due to claustrophobia , she may be able to do one outpatient that isn't so confined. MRI canceled.

## 2013-06-05 NOTE — Progress Notes (Signed)
[facility] PROGRESS NOTE  Marcia Richardson WUJ:[id number] DOB: 1980-12-01 DOA: 06/03/2013 PCP: No primary provider on file.  Brief narrative: Marcia Richardson is an 32 y.o. female with no significant PMH who was admitted on 06/03/2013 with pyelonephritis and incidentally discovered microcytic anemia with a presenting hemoglobin of 6.2 mg/dL. During the course of her hospital stay, the patient also complained of galactorrhea.  Assessment/Plan: Principal Problem:   Pyelonephritis -Treated with empiric Rocephin, organism sensitive. -Will switch antibiotics to oral Cipro and monitor another 24 hours to ensure tolerance of oral antibiotics and continued improvement. -Continue supportive care with analgesia and antiemetics as needed. Active Problems:   Galactorrhea -Prolactin levels within normal limits.  UPT done on admission negative. Likely physiologic but will need a mammogram and further evaluation at the breast Center. Would also recommend outpatient OB/GYN followup.   Low TSH -Free T4 is within normal limits. Suspect sick euthyroid syndrome.   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 -Anemia profile consistent with iron deficiency. Start iron replacement therapy. -Transvaginal ultrasound reviewed. No fibroids or abnormal endometrial tissue noted.   Hypokalemia -Supplementing. Magnesium levels okay.  Code Status: Full. Family Communication: Sister updated at bedside 06/04/2013. Disposition Plan: Home when stable.   Medical Consultants:  None.  Other Consultants:  None.  Anti-infectives: Rocephin 06/03/2013---> 06/05/2013 Cipro 06/05/2013--->  HPI/Subjective: Marcia Richardson is beginning to feel better. She has less back pain and less nausea. Her appetite is picking up. Continues to have nipple discharge with manipulation of the nipples.  Objective: Filed Vitals:   06/04/13 1305 06/04/13 1422 06/04/13 2208  06/05/13 0549  BP: 105/66 116/58 108/63 109/69  Pulse: 72 69 75 97  Temp: 97.6 F (36.4 C) 97.6 F (36.4 C) 98.6 F (37 C) 97.9 F (36.6 C)  TempSrc: Oral Oral Oral Oral  Resp: 18 18 18 18   Height:      Weight:      SpO2:   100% 100%    Intake/Output Summary (Last 24 hours) at 06/05/13 9562 Last data filed at 06/05/13 0629  Gross per 24 hour  Intake 1476.25 ml  Output    151 ml  Net 1325.25 ml    Exam: Gen:  Tearful. Breast exam: No palpable axillary lymphadenopathy or breast masses. Clear/milky nipple discharge with nipple manipulation. Eyes: Left peripheral visual defect. Cardiovascular:  RRR, No M/R/G Respiratory:  Lungs CTAB Gastrointestinal:  Abdomen soft, NT/ND, + BS Extremities:  No C/E/C  Data Reviewed: Basic Metabolic Panel:  Recent Labs Lab 06/03/13 1909 06/03/13 2145 06/04/13 0830 06/05/13 0521  NA 134*  --  133* 133*  K 3.2*  --  3.2* 3.1*  CL 97  --  96 100  CO2 21  --  24 23  GLUCOSE 88  --  98 92  BUN 7  --  4* <3*  CREATININE 0.57  --  0.56 0.50  CALCIUM 9.3  --  8.9 8.9  MG  --  2.0  --   --    GFR Estimated Creatinine Clearance: 68.2 ml/min (by C-G formula based on Cr of 0.5). Liver Function Tests:  Recent Labs Lab 06/03/13 1909  AST 12  ALT 6  ALKPHOS 68  BILITOT 0.8  PROT 7.6  ALBUMIN 3.6    Recent Labs Lab 06/03/13 1909  LIPASE 14   CBC:  Recent Labs Lab 06/03/13 1909 06/04/13 0830 06/05/13 0521  WBC 10.3 9.7 9.2  NEUTROABS 8.4*  --   --  HGB 6.2* 8.0* 9.1*  HCT 22.7* 27.7* 29.8*  MCV 61.5* 67.1* 69.1*  PLT 258 PLATELET CLUMPS NOTED ON SMEAR, COUNT APPEARS ADEQUATE 215   Thyroid function studies  Recent Labs  06/03/13 2145  TSH 0.128*   Anemia work up  Recent Labs  06/03/13 2145  VITAMINB12 515  FOLATE 13.7  FERRITIN 7*  TIBC Not calculated due to Iron <10.  IRON <10*  RETICCTPCT 1.4   Microbiology Recent Results (from the past 240 hour(s))  URINE CULTURE     Status: None   Collection Time     06/03/13  7:35 PM      Result Value Range Status   Specimen Description URINE, CLEAN CATCH   Final   Special Requests NONE   Final   Culture  Setup Time 06/04/2013 02:08   Final   Colony Count >=100,000 COLONIES/ML   Final   Culture ESCHERICHIA COLI   Final   Report Status PENDING   Incomplete     Procedures and Diagnostic Studies: Ct Abdomen Pelvis W Contrast  06/03/2013   *RADIOLOGY REPORT*  Clinical Data: Lower quadrant abdominal pain and 18 units.  CT ABDOMEN AND PELVIS WITH CONTRAST  Technique:  Multidetector CT imaging of the abdomen and pelvis was performed following the standard protocol during bolus administration of intravenous contrast.  Contrast: 80mL OMNIPAQUE IOHEXOL 300 MG/ML  SOLN, 50mL OMNIPAQUE IOHEXOL 300 MG/ML  SOLN  Comparison: None.  Findings: The liver, gallbladder, pancreas, spleen and adrenal glands are within normal limits.  Within the left kidney, there are two focal areas of diminished perfusion identified at the anterior upper pole and the lateral mid kidney.  These are well appreciated on coronal reconstructions.  There may also be another subtle area of diminished perfusion in the lower pole of the left kidney. These findings may be reflective of pyelonephritis and correlation is suggested with urinalysis and location of pain.  No hydronephrosis, renal abscess or renal calculi are identified.  Bowel loops are normal caliber show no evidence of obstruction or inflammation.  A no free fluid, abnormal fluid collection or free intraperitoneal air is identified.  In the pelvis, bilateral adnexal regions show prominent cyst.  On the right side, the unilocular cyst measures approximately 3.4 x 4.2 cm.  On the left side, single cyst measures 3.2 cm in greatest diameter.  No free fluid is seen in the pelvis.  The bladder is unremarkable.  No masses or enlarged lymph nodes are seen.  No vascular abnormalities are identified.  No evidence of hernia.  Bony structures are within  normal limits.  IMPRESSION:  1.  Focal areas of diminished perfusion in the left kidney suggestive of pyelonephritis.  No hydronephrosis or abscess is identified.  Correlation suggested with urinalysis and location of the patient's pain. 2.  Bilateral adnexal cysts without obvious evidence of rupture by CT.   Original Report Authenticated By: Irish Lack, M.D.   US Transvaginal Non-ob / US pelvis complete / [place] Art/ven Flow Abd Pelv Doppler  06/04/2013   *RADIOLOGY REPORT*  Clinical Data:  Pelvic pain and menorrhagia. Anemia.  TRANSABDOMINAL AND TRANSVAGINAL ULTRASOUND OF PELVIS AND DOPPLER ULTRASOUND OF OVARIES  Technique:  Both transabdominal and transvaginal ultrasound examinations of the pelvis were performed. Transabdominal technique was performed for global imaging of the pelvis including uterus, ovaries, adnexal regions, and pelvic cul-de-sac.  Color and duplex Doppler ultrasound was utilized to evaluate blood flow to the ovaries.  It was necessary to proceed with endovaginal exam following  the transabdominal exam to visualize the ovaries and uterus.  Comparison:  CT abdomen pelvis 06/03/2013  Findings:  Uterus: The uterus is retroflexed and measures 6.7 x 3.4 x 4.8 cm. Echotexture appears normal.  No focal uterine mass.  Endometrium: Measures 8 mm in maximal thickness.  Appears within normal limits.  Right ovary:  Measures 5.5 x 3.1 x 4.7 cm and contains a 4.3 x 2.5 x 3.7 cm simple cyst.  A few small follicles are seen within the periphery of the ovary.  Color Doppler flow is identified to the ovarian parenchyma, but is not seen within the cyst.  Venous and arterial waveforms are seen within the right ovarian parenchyma on Doppler imaging.  Left ovary: Measures 5.2 x 3.1 x 4.8 cm and contains a 4.2 x 2.2 x 3.6 cm cyst witha very thin internal septation.  A few tiny follicles are seen within the ovarian parenchyma.  Color Doppler flow is seen to the left ovary.  Arterial and venous waveforms are seen  within the left ovarian parenchyma on Doppler imaging.  Other findings: Small amount of simple free fluid.  IMPRESSION:  1.  Bilateral ovarian cysts (one within each ovary).  No evidence of ovarian torsion. This is almost certainly benign, and no specific imaging follow up is recommended according to the Society of Radiologists in Ultrasound 2010 Consensus  Conference Statement (D Lenis Noon et al. Management of Asymptomatic Ovarian and Other Adnexal Cysts Imaged at [place]:  Society of Radiologists in Ultrasound Consensus Conference Statement 2010.  Radiology 256 (Sept 2010): 943-954.).  2.  The uterus and endometrium are within normal limits.   Original Report Authenticated By: Britta Mccreedy, M.D.    Scheduled Meds: . cefTRIAXone (ROCEPHIN)  IV  1 g Intravenous Q24H  . feeding supplement  1 Container Oral BID BM  . multivitamin with minerals  1 tablet Oral Daily  . sodium chloride  3 mL Intravenous Q12H   Continuous Infusions: . 0.9 % NaCl with KCl 20 mEq / L 50 mL/hr at 06/04/13 2301    Time spent: 35 minutes with > 50% of time spent discussing current diagnostic test results, clinical impression, and plan of care.   LOS: 2 days   RAMA,CHRISTINA  [facility] Pager [phone].   *Please note that the hospitalists switch teams on Wednesdays. Please call the flow manager at [phone] if you are having difficulty reaching the hospitalist taking care of this patient as she can update you and provide the most up-to-date pager number of provider caring for the patient. If 8PM-8AM, please contact night-coverage at [email or web address], password [facility]  06/05/2013, 8:22 AM

## 2013-06-06 DIAGNOSIS — I472 Ventricular tachycardia: Secondary | ICD-10-CM | POA: Diagnosis not present

## 2013-06-06 LAB — BASIC METABOLIC PANEL
Calcium: 8.7 mg/dL (ref 8.4–10.5)
GFR calc non Af Amer: >90 mL/min (ref >90)
Sodium: 136 mEq/L (ref 135–145)

## 2013-06-06 MED ORDER — FERROUS SULFATE 325 (65 FE) MG PO TABS: 325.0000 mg | ORAL_TABLET | Freq: Two times a day (BID) | ORAL | Status: DC

## 2013-06-06 MED ORDER — CIPROFLOXACIN HCL 500 MG PO TABS: 500.0000 mg | ORAL_TABLET | Freq: Two times a day (BID) | ORAL | Status: DC

## 2013-06-06 NOTE — Care Management Note (Signed)
    Page 1 of 1   06/06/2013     4:46:58 PM   CARE MANAGEMENT NOTE 06/06/2013  Patient:  TENNYSON, KALLEN   Account Number:  [id number]  Date Initiated:  06/06/2013  Documentation initiated by:  Lanier Clam  Subjective/Objective Assessment:   ADMITTED W/PYELONEPHRITIS     Action/Plan:   FROM HOME.NO PCP.   Anticipated DC Date:  06/06/2013   Anticipated DC Plan:  HOME/SELF CARE      DC Planning Services  MATCH Program  [facility]  CM consult      Choice offered to / List presented to:             Status of service:  Completed, signed off Medicare Important Message given?   (If response is "NO", the following Medicare IM given date fields will be blank) Date Medicare IM given:   Date Additional Medicare IM given:    Discharge Disposition:  HOME/SELF CARE  Per UR Regulation:  Reviewed for med. necessity/level of care/duration of stay  If discussed at Long Length of Stay Meetings, dates discussed:    Comments:  06/06/13 Clarabelle Oscarson RN,BSN NCM WEEKEND [phone] MATCH PROGRAM UTILIZED FOR [facility].INFORMED PATIENT OF POLICY:1X USE WITHIN 12 CALENDAR MONTHS/$3 CO PAY-STATES SHE HAS/MUST FILL SCRIPT WITHIN 7 DAYS/NO NARCOTICS.PROVIDED W/FORM TO TAKE TO SELECT PHARMACIES LISTED W/SCRIPT.PATIENT VOICED UNDERSTANDING.PROVIDED W/COMMUNITY WELLNESS INFO FOR PCP APPT.COMMUNITY RESOURCES PROVIDED:HEALTH INSURANCE INFO,WEBSITE NEEDYMEDS,LOCAL CHURCHES.

## 2013-06-06 NOTE — Discharge Summary (Signed)
Physician Discharge Summary  Marcia Richardson ZOX:[id number] DOB: [birth year] DOA: 06/03/2013  PCP: No primary provider on file.   Referred to the [facility]  Admit date: 06/03/2013 Discharge date: 06/06/2013  Recommendations for Outpatient Follow-up:  1. The patient is being referred to the [facility] for outpatient followup. 2. Recommend a repeat check of her blood counts in one week. 3. Recommend outpatient referral to OB/GYN for further evaluation of menorrhagia and galactorrhea.  Note: Prolactin levels within normal limits during hospital stay. Patient unable to tolerate MRI. May need further evaluation with outpatient open MRI scanner if further evaluation deemed necessary. 4. Recommend outpatient mammogram.  Discharge Diagnoses:  Principal Problem:    Pyelonephritis Active Problems:    Microcytic anemia    Hypokalemia    Low TSH level    Galactorrhea    Nonsustained ventricular tachycardia   Discharge Condition:  Improved.  Diet recommendation: Regular.  History of present illness:  Marcia Richardson is an 32 y.o. female with no significant PMH who was admitted on 06/03/2013 with pyelonephritis and incidentally discovered microcytic anemia with a presenting hemoglobin of 6.2 mg/dL. During the course of her hospital stay, the patient also complained of galactorrhea.  Hospital Course by problem:  Principal Problem:  Pyelonephritis  -Treated with empiric Rocephin which was transitioned to oral Cipro based on sensitivity data.  -Provided with supportive care including IV fluids, antinausea medicines until symptoms resolved. Active Problems:  Galactorrhea  -Prolactin levels within normal limits. UPT done on admission negative. Likely physiologic but will need a mammogram and further evaluation at the breast Center. Would also recommend outpatient OB/GYN followup.  Low TSH  -Free T4 is within normal limits. Suspect sick  euthyroid syndrome.  Microcytic anemia secondary to chronic blood loss from menorrhagia  -Given 2 units of packed red blood cells.  -Anemia profile consistent with iron deficiency. Start iron replacement therapy.  -Transvaginal ultrasound reviewed. No fibroids or abnormal endometrial tissue noted.  Hypokalemia / Nonsustained ventricular tachycardia -Supplementing. Magnesium levels okay. -One 8 beat run of V. tach noted on telemetry when potassium was low. No further evaluation or workup felt to be necessary at this time.   Discharge Exam: Filed Vitals:   06/06/13 0627  BP: 100/51  Pulse: 55  Temp: 98.2 F (36.8 C)  Resp: 16   Filed Vitals:   06/05/13 0913 06/05/13 1300 06/05/13 2232 06/06/13 0627  BP: 136/73 107/67 125/63 100/51  Pulse: 68 70 58 55  Temp:  97.9 F (36.6 C) 97.3 F (36.3 C) 98.2 F (36.8 C)  TempSrc:  Oral Oral Oral  Resp: 12 15 16 16   Height:      Weight:      SpO2: 100% 100% 100% 95%    Gen:  NAD Cardiovascular:  RRR, No M/R/G Respiratory: Lungs CTAB Gastrointestinal: Abdomen soft, NT/ND with normal active bowel sounds. Extremities: No C/E/C   Discharge Instructions     Medication List         ciprofloxacin 500 MG tablet  Commonly known as:  CIPRO  Take 1 tablet (500 mg total) by mouth 2 (two) times daily.     ferrous sulfate 325 (65 FE) MG tablet  Take 1 tablet (325 mg total) by mouth 2 (two) times daily with a meal.          The results of significant diagnostics from this hospitalization (including imaging, microbiology, ancillary and laboratory) are listed below for reference.    Significant Diagnostic Studies:  US Transvaginal Non-ob  06/04/2013   *RADIOLOGY REPORT*  Clinical Data:  Pelvic pain and menorrhagia. Anemia.  TRANSABDOMINAL AND TRANSVAGINAL ULTRASOUND OF PELVIS AND DOPPLER ULTRASOUND OF OVARIES  Technique:  Both transabdominal and transvaginal ultrasound examinations of the pelvis were performed. Transabdominal technique  was performed for global imaging of the pelvis including uterus, ovaries, adnexal regions, and pelvic cul-de-sac.  Color and duplex Doppler ultrasound was utilized to evaluate blood flow to the ovaries.  It was necessary to proceed with endovaginal exam following the transabdominal exam to visualize the ovaries and uterus.  Comparison:  CT abdomen pelvis 06/03/2013  Findings:  Uterus: The uterus is retroflexed and measures 6.7 x 3.4 x 4.8 cm. Echotexture appears normal.  No focal uterine mass.  Endometrium: Measures 8 mm in maximal thickness.  Appears within normal limits.  Right ovary:  Measures 5.5 x 3.1 x 4.7 cm and contains a 4.3 x 2.5 x 3.7 cm simple cyst.  A few small follicles are seen within the periphery of the ovary.  Color Doppler flow is identified to the ovarian parenchyma, but is not seen within the cyst.  Venous and arterial waveforms are seen within the right ovarian parenchyma on Doppler imaging.  Left ovary: Measures 5.2 x 3.1 x 4.8 cm and contains a 4.2 x 2.2 x 3.6 cm cyst witha very thin internal septation.  A few tiny follicles are seen within the ovarian parenchyma.  Color Doppler flow is seen to the left ovary.  Arterial and venous waveforms are seen within the left ovarian parenchyma on Doppler imaging.  Other findings: Small amount of simple free fluid.  IMPRESSION:  1.  Bilateral ovarian cysts (one within each ovary).  No evidence of ovarian torsion. This is almost certainly benign, and no specific imaging follow up is recommended according to the Society of Radiologists in Ultrasound 2010 Consensus  Conference Statement (D Lenis Noon et al. Management of Asymptomatic Ovarian and Other Adnexal Cysts Imaged at [place]:  Society of Radiologists in Ultrasound Consensus Conference Statement 2010.  Radiology 256 (Sept 2010): 943-954.).  2.  The uterus and endometrium are within normal limits.   Original Report Authenticated By: Britta Mccreedy, M.D.   US Pelvis Complete  06/04/2013   *RADIOLOGY REPORT*   Clinical Data:  Pelvic pain and menorrhagia. Anemia.  TRANSABDOMINAL AND TRANSVAGINAL ULTRASOUND OF PELVIS AND DOPPLER ULTRASOUND OF OVARIES  Technique:  Both transabdominal and transvaginal ultrasound examinations of the pelvis were performed. Transabdominal technique was performed for global imaging of the pelvis including uterus, ovaries, adnexal regions, and pelvic cul-de-sac.  Color and duplex Doppler ultrasound was utilized to evaluate blood flow to the ovaries.  It was necessary to proceed with endovaginal exam following the transabdominal exam to visualize the ovaries and uterus.  Comparison:  CT abdomen pelvis 06/03/2013  Findings:  Uterus: The uterus is retroflexed and measures 6.7 x 3.4 x 4.8 cm. Echotexture appears normal.  No focal uterine mass.  Endometrium: Measures 8 mm in maximal thickness.  Appears within normal limits.  Right ovary:  Measures 5.5 x 3.1 x 4.7 cm and contains a 4.3 x 2.5 x 3.7 cm simple cyst.  A few small follicles are seen within the periphery of the ovary.  Color Doppler flow is identified to the ovarian parenchyma, but is not seen within the cyst.  Venous and arterial waveforms are seen within the right ovarian parenchyma on Doppler imaging.  Left ovary: Measures 5.2 x 3.1 x 4.8 cm and contains a 4.2 x 2.2 x  3.6 cm cyst witha very thin internal septation.  A few tiny follicles are seen within the ovarian parenchyma.  Color Doppler flow is seen to the left ovary.  Arterial and venous waveforms are seen within the left ovarian parenchyma on Doppler imaging.  Other findings: Small amount of simple free fluid.  IMPRESSION:  1.  Bilateral ovarian cysts (one within each ovary).  No evidence of ovarian torsion. This is almost certainly benign, and no specific imaging follow up is recommended according to the Society of Radiologists in Ultrasound 2010 Consensus  Conference Statement (D Lenis Noon et al. Management of Asymptomatic Ovarian and Other Adnexal Cysts Imaged at [place]:  Society of  Radiologists in Ultrasound Consensus Conference Statement 2010.  Radiology 256 (Sept 2010): 943-954.).  2.  The uterus and endometrium are within normal limits.   Original Report Authenticated By: Britta Mccreedy, M.D.   Ct Abdomen Pelvis W Contrast  06/03/2013   *RADIOLOGY REPORT*  Clinical Data: Lower quadrant abdominal pain and 18 units.  CT ABDOMEN AND PELVIS WITH CONTRAST  Technique:  Multidetector CT imaging of the abdomen and pelvis was performed following the standard protocol during bolus administration of intravenous contrast.  Contrast: 80mL OMNIPAQUE IOHEXOL 300 MG/ML  SOLN, 50mL OMNIPAQUE IOHEXOL 300 MG/ML  SOLN  Comparison: None.  Findings: The liver, gallbladder, pancreas, spleen and adrenal glands are within normal limits.  Within the left kidney, there are two focal areas of diminished perfusion identified at the anterior upper pole and the lateral mid kidney.  These are well appreciated on coronal reconstructions.  There may also be another subtle area of diminished perfusion in the lower pole of the left kidney. These findings may be reflective of pyelonephritis and correlation is suggested with urinalysis and location of pain.  No hydronephrosis, renal abscess or renal calculi are identified.  Bowel loops are normal caliber show no evidence of obstruction or inflammation.  A no free fluid, abnormal fluid collection or free intraperitoneal air is identified.  In the pelvis, bilateral adnexal regions show prominent cyst.  On the right side, the unilocular cyst measures approximately 3.4 x 4.2 cm.  On the left side, single cyst measures 3.2 cm in greatest diameter.  No free fluid is seen in the pelvis.  The bladder is unremarkable.  No masses or enlarged lymph nodes are seen.  No vascular abnormalities are identified.  No evidence of hernia.  Bony structures are within normal limits.  IMPRESSION:  1.  Focal areas of diminished perfusion in the left kidney suggestive of pyelonephritis.  No  hydronephrosis or abscess is identified.  Correlation suggested with urinalysis and location of the patient's pain. 2.  Bilateral adnexal cysts without obvious evidence of rupture by CT.   Original Report Authenticated By: Irish Lack, M.D.   [place] Art/ven Flow Abd Pelv Doppler  06/04/2013   *RADIOLOGY REPORT*  Clinical Data:  Pelvic pain and menorrhagia. Anemia.  TRANSABDOMINAL AND TRANSVAGINAL ULTRASOUND OF PELVIS AND DOPPLER ULTRASOUND OF OVARIES  Technique:  Both transabdominal and transvaginal ultrasound examinations of the pelvis were performed. Transabdominal technique was performed for global imaging of the pelvis including uterus, ovaries, adnexal regions, and pelvic cul-de-sac.  Color and duplex Doppler ultrasound was utilized to evaluate blood flow to the ovaries.  It was necessary to proceed with endovaginal exam following the transabdominal exam to visualize the ovaries and uterus.  Comparison:  CT abdomen pelvis 06/03/2013  Findings:  Uterus: The uterus is retroflexed and measures 6.7 x 3.4 x 4.8 cm. Echotexture  appears normal.  No focal uterine mass.  Endometrium: Measures 8 mm in maximal thickness.  Appears within normal limits.  Right ovary:  Measures 5.5 x 3.1 x 4.7 cm and contains a 4.3 x 2.5 x 3.7 cm simple cyst.  A few small follicles are seen within the periphery of the ovary.  Color Doppler flow is identified to the ovarian parenchyma, but is not seen within the cyst.  Venous and arterial waveforms are seen within the right ovarian parenchyma on Doppler imaging.  Left ovary: Measures 5.2 x 3.1 x 4.8 cm and contains a 4.2 x 2.2 x 3.6 cm cyst witha very thin internal septation.  A few tiny follicles are seen within the ovarian parenchyma.  Color Doppler flow is seen to the left ovary.  Arterial and venous waveforms are seen within the left ovarian parenchyma on Doppler imaging.  Other findings: Small amount of simple free fluid.  IMPRESSION:  1.  Bilateral ovarian cysts (one within each  ovary).  No evidence of ovarian torsion. This is almost certainly benign, and no specific imaging follow up is recommended according to the Society of Radiologists in Ultrasound 2010 Consensus  Conference Statement (D Lenis Noon et al. Management of Asymptomatic Ovarian and Other Adnexal Cysts Imaged at [place]:  Society of Radiologists in Ultrasound Consensus Conference Statement 2010.  Radiology 256 (Sept 2010): 943-954.).  2.  The uterus and endometrium are within normal limits.   Original Report Authenticated By: Britta Mccreedy, M.D.    Labs:  Basic Metabolic Panel:  Recent Labs Lab 06/03/13 1909 06/03/13 2145 06/04/13 0830 06/05/13 0521 06/06/13 0516  NA 134*  --  133* 133* 136  K 3.2*  --  3.2* 3.1* 3.6  CL 97  --  96 100 103  CO2 21  --  24 23 23   GLUCOSE 88  --  98 92 73  BUN 7  --  4* <3* 4*  CREATININE 0.57  --  0.56 0.50 0.55  CALCIUM 9.3  --  8.9 8.9 8.7  MG  --  2.0  --   --   --    GFR Estimated Creatinine Clearance: 68.2 ml/min (by C-G formula based on Cr of 0.55). Liver Function Tests:  Recent Labs Lab 06/03/13 1909  AST 12  ALT 6  ALKPHOS 68  BILITOT 0.8  PROT 7.6  ALBUMIN 3.6    Recent Labs Lab 06/03/13 1909  LIPASE 14   CBC:  Recent Labs Lab 06/03/13 1909 06/04/13 0830 06/05/13 0521  WBC 10.3 9.7 9.2  NEUTROABS 8.4*  --   --   HGB 6.2* 8.0* 9.1*  HCT 22.7* 27.7* 29.8*  MCV 61.5* 67.1* 69.1*  PLT 258 PLATELET CLUMPS NOTED ON SMEAR, COUNT APPEARS ADEQUATE 215   Thyroid function studies  Recent Labs  06/03/13 2145  TSH 0.128*    Ref. Range 06/04/2013 08:30  Free T4 Latest Range: 0.80-1.80 ng/dL 4.54   Anemia work up  Recent Labs  06/03/13 2145  VITAMINB12 515  FOLATE 13.7  FERRITIN 7*  TIBC Not calculated due to Iron <10.  IRON <10*  RETICCTPCT 1.4    Ref. Range 06/03/2013 19:29  hCG, Beta Chain, Quant, S Latest Range: <5 mIU/mL <1    Ref. Range 06/04/2013 08:00  Prolactin No range found 13.9   Microbiology Recent Results (from  the past 240 hour(s))  URINE CULTURE     Status: None   Collection Time    06/03/13  7:35 PM  Result Value Range Status   Specimen Description URINE, CLEAN CATCH   Final   Special Requests NONE   Final   Culture  Setup Time 06/04/2013 02:08   Final   Colony Count >=100,000 COLONIES/ML   Final   Culture ESCHERICHIA COLI   Final   Report Status 06/05/2013 FINAL   Final   Organism ID, Bacteria ESCHERICHIA COLI   Final    Time coordinating discharge: 25 minutes.  Signed:  RAMA,CHRISTINA  Pager [phone] [facility] 06/06/2013, 11:36 AM

## 2013-06-18 ENCOUNTER — Ambulatory Visit: Payer: Self-pay

## 2013-08-11 ENCOUNTER — Inpatient Hospital Stay (HOSPITAL_COMMUNITY): Payer: Self-pay

## 2013-08-11 ENCOUNTER — Inpatient Hospital Stay (HOSPITAL_COMMUNITY)
Admission: EM | Admit: 2013-08-11 | Discharge: 2013-08-13 | DRG: 872 | Disposition: A | Discharge status: Home / Self Care | Payer: Self-pay | Attending: Internal Medicine | Admitting: Internal Medicine

## 2013-08-11 ENCOUNTER — Encounter (HOSPITAL_COMMUNITY): Payer: Self-pay | Admitting: *Deleted

## 2013-08-11 ENCOUNTER — Inpatient Hospital Stay (HOSPITAL_COMMUNITY): Payer: MEDICAID

## 2013-08-11 DIAGNOSIS — I959 Hypotension, unspecified: Secondary | ICD-10-CM | POA: Diagnosis present

## 2013-08-11 DIAGNOSIS — N8300 Follicular cyst of ovary, unspecified side: Secondary | ICD-10-CM | POA: Diagnosis present

## 2013-08-11 DIAGNOSIS — G43909 Migraine, unspecified, not intractable, without status migrainosus: Secondary | ICD-10-CM | POA: Diagnosis present

## 2013-08-11 DIAGNOSIS — N10 Acute tubulo-interstitial nephritis: Secondary | ICD-10-CM | POA: Diagnosis present

## 2013-08-11 DIAGNOSIS — Z87891 Personal history of nicotine dependence: Secondary | ICD-10-CM

## 2013-08-11 DIAGNOSIS — D509 Iron deficiency anemia, unspecified: Secondary | ICD-10-CM | POA: Diagnosis present

## 2013-08-11 DIAGNOSIS — D72829 Elevated white blood cell count, unspecified: Secondary | ICD-10-CM | POA: Diagnosis present

## 2013-08-11 DIAGNOSIS — E871 Hypo-osmolality and hyponatremia: Secondary | ICD-10-CM | POA: Diagnosis present

## 2013-08-11 DIAGNOSIS — N920 Excessive and frequent menstruation with regular cycle: Secondary | ICD-10-CM | POA: Diagnosis present

## 2013-08-11 DIAGNOSIS — E876 Hypokalemia: Secondary | ICD-10-CM | POA: Diagnosis present

## 2013-08-11 DIAGNOSIS — D649 Anemia, unspecified: Secondary | ICD-10-CM | POA: Diagnosis present

## 2013-08-11 DIAGNOSIS — A419 Sepsis, unspecified organism: Principal | ICD-10-CM | POA: Diagnosis present

## 2013-08-11 DIAGNOSIS — N12 Tubulo-interstitial nephritis, not specified as acute or chronic: Secondary | ICD-10-CM | POA: Diagnosis present

## 2013-08-11 LAB — COMPREHENSIVE METABOLIC PANEL
ALT: 9 U/L (ref 0–35)
ALT: 8 U/L (ref 0–35)
AST: 14 U/L (ref 0–37)
AST: 14 U/L (ref 0–37)
Alkaline Phosphatase: 71 U/L (ref 39–117)
BUN: 7 mg/dL (ref 6–23)
CO2: 20 mEq/L (ref 19–32)
CO2: 23 mEq/L (ref 19–32)
Calcium: 9.2 mg/dL (ref 8.4–10.5)
Chloride: 94 mEq/L — ABNORMAL LOW (ref 96–112)
Creatinine, Ser: 0.69 mg/dL (ref 0.50–1.10)
GFR calc Af Amer: >90 mL/min (ref >90)
GFR calc non Af Amer: >90 mL/min (ref >90)
GFR calc non Af Amer: >90 mL/min (ref >90)
Glucose, Bld: 81 mg/dL (ref 70–99)
Potassium: 3.4 mEq/L — ABNORMAL LOW (ref 3.5–5.1)
Sodium: 130 mEq/L — ABNORMAL LOW (ref 135–145)
Total Bilirubin: 1.1 mg/dL (ref 0.3–1.2)
Total Protein: 7.9 g/dL (ref 6.0–8.3)

## 2013-08-11 LAB — CBC WITH DIFFERENTIAL/PLATELET
Basophils Absolute: 0 10*3/uL (ref 0.0–0.1)
Basophils Absolute: 0 10*3/uL (ref 0.0–0.1)
Basophils Relative: 0 % (ref 0–1)
Eosinophils Absolute: 0 10*3/uL (ref 0.0–0.7)
Eosinophils Relative: 0 % (ref 0–5)
HCT: 34.1 % — ABNORMAL LOW (ref 36.0–46.0)
HCT: 32.1 % — ABNORMAL LOW (ref 36.0–46.0)
Hemoglobin: 11.3 g/dL — ABNORMAL LOW (ref 12.0–15.0)
Hemoglobin: 10.4 g/dL — ABNORMAL LOW (ref 12.0–15.0)
Lymphocytes Relative: 15 % (ref 12–46)
MCH: 25.4 pg — ABNORMAL LOW (ref 26.0–34.0)
MCHC: 32.4 g/dL (ref 30.0–36.0)
Monocytes Absolute: 1.6 10*3/uL — ABNORMAL HIGH (ref 0.1–1.0)
Monocytes Absolute: 1.5 10*3/uL — ABNORMAL HIGH (ref 0.1–1.0)
Monocytes Relative: 12 % (ref 3–12)
Neutro Abs: 10.2 10*3/uL — ABNORMAL HIGH (ref 1.7–7.7)
RBC: 4.39 MIL/uL (ref 3.87–5.11)
RDW: 19.5 % — ABNORMAL HIGH (ref 11.5–15.5)
RDW: 19.7 % — ABNORMAL HIGH (ref 11.5–15.5)
WBC: 14 10*3/uL — ABNORMAL HIGH (ref 4.0–10.5)

## 2013-08-11 LAB — MRSA PCR SCREENING: MRSA by PCR: NEGATIVE

## 2013-08-11 LAB — ETHANOL
Alcohol, Ethyl (B): <11 mg/dL (ref 0–11)
Alcohol, Ethyl (B): <11 mg/dL (ref 0–11)

## 2013-08-11 LAB — URINALYSIS, ROUTINE W REFLEX MICROSCOPIC
Bilirubin Urine: NEGATIVE
Ketones, ur: 40 mg/dL — AB
Protein, ur: 100 mg/dL — AB
Urobilinogen, UA: 2 mg/dL — ABNORMAL HIGH (ref 0.0–1.0)

## 2013-08-11 LAB — RAPID URINE DRUG SCREEN, HOSP PERFORMED
Amphetamines: NOT DETECTED
Benzodiazepines: NOT DETECTED
Tetrahydrocannabinol: POSITIVE — AB

## 2013-08-11 LAB — MAGNESIUM: Magnesium: 1.9 mg/dL (ref 1.5–2.5)

## 2013-08-11 LAB — PHOSPHORUS: Phosphorus: 2.9 mg/dL (ref 2.3–4.6)

## 2013-08-11 LAB — PROTIME-INR: INR: 1.33 (ref 0.00–1.49)

## 2013-08-11 LAB — URINE MICROSCOPIC-ADD ON

## 2013-08-11 MED ORDER — IOHEXOL 300 MG/ML  SOLN
50.0000 mL | Freq: Once | INTRAMUSCULAR | Status: AC | PRN
  Administered 2013-08-11: 50 mL via ORAL

## 2013-08-11 MED ORDER — DEXTROSE 5 % IV SOLN
1.0000 g | Freq: Once | INTRAVENOUS | Status: AC
  Administered 2013-08-11: 1 g via INTRAVENOUS
  Filled 2013-08-11: qty 10

## 2013-08-11 MED ORDER — ACETAMINOPHEN 325 MG PO TABS
650.0000 mg | ORAL_TABLET | Freq: Four times a day (QID) | ORAL | Status: DC | PRN
  Administered 2013-08-11: 650 mg via ORAL
  Filled 2013-08-11: qty 2

## 2013-08-11 MED ORDER — HYDROCODONE-ACETAMINOPHEN 5-325 MG PO TABS: 1.0000 | ORAL_TABLET | ORAL | Status: DC | PRN

## 2013-08-11 MED ORDER — ONDANSETRON HCL 4 MG/2ML IJ SOLN
4.0000 mg | Freq: Once | INTRAMUSCULAR | Status: AC
  Administered 2013-08-11: 4 mg via INTRAVENOUS
  Filled 2013-08-11: qty 2

## 2013-08-11 MED ORDER — SODIUM CHLORIDE 0.9 % IV SOLN
INTRAVENOUS | Status: DC
  Administered 2013-08-11: 17:00:00 via INTRAVENOUS

## 2013-08-11 MED ORDER — HYDROMORPHONE HCL PF 1 MG/ML IJ SOLN: 0.5000 mg | Freq: Once | INTRAMUSCULAR | Status: DC

## 2013-08-11 MED ORDER — IOHEXOL 300 MG/ML  SOLN
100.0000 mL | Freq: Once | INTRAMUSCULAR | Status: AC | PRN
  Administered 2013-08-11: 80 mL via INTRAVENOUS

## 2013-08-11 MED ORDER — HYDROMORPHONE HCL PF 1 MG/ML IJ SOLN
1.0000 mg | INTRAMUSCULAR | Status: DC | PRN
  Administered 2013-08-12: 1 mg via INTRAVENOUS
  Filled 2013-08-11 (×2): qty 2

## 2013-08-11 MED ORDER — ACETAMINOPHEN 650 MG RE SUPP: 650.0000 mg | Freq: Four times a day (QID) | RECTAL | Status: DC | PRN

## 2013-08-11 MED ORDER — POTASSIUM CHLORIDE CRYS ER 20 MEQ PO TBCR
40.0000 meq | EXTENDED_RELEASE_TABLET | Freq: Once | ORAL | Status: DC
  Filled 2013-08-11: qty 2

## 2013-08-11 MED ORDER — HYDROMORPHONE HCL PF 1 MG/ML IJ SOLN
0.5000 mg | Freq: Once | INTRAMUSCULAR | Status: AC
  Administered 2013-08-11: 0.5 mg via INTRAVENOUS
  Filled 2013-08-11: qty 1

## 2013-08-11 MED ORDER — MORPHINE SULFATE 2 MG/ML IJ SOLN
1.0000 mg | INTRAMUSCULAR | Status: DC | PRN
  Administered 2013-08-11: 1 mg via INTRAVENOUS
  Filled 2013-08-11: qty 1

## 2013-08-11 MED ORDER — SODIUM CHLORIDE 0.9 % IJ SOLN: 3.0000 mL | Freq: Two times a day (BID) | INTRAMUSCULAR | Status: DC

## 2013-08-11 MED ORDER — SODIUM CHLORIDE 0.9 % IV SOLN
INTRAVENOUS | Status: DC
  Administered 2013-08-11 – 2013-08-12 (×2): via INTRAVENOUS
  Administered 2013-08-12: 75 mL/h via INTRAVENOUS
  Administered 2013-08-13: 100 mL/h via INTRAVENOUS

## 2013-08-11 MED ORDER — SODIUM CHLORIDE 0.9 % IV BOLUS (SEPSIS)
1000.0000 mL | Freq: Once | INTRAVENOUS | Status: AC
  Administered 2013-08-11: 1000 mL via INTRAVENOUS

## 2013-08-11 MED ORDER — DEXTROSE 5 % IV SOLN
1.0000 g | INTRAVENOUS | Status: DC
  Filled 2013-08-11: qty 10

## 2013-08-11 MED ORDER — HYDROMORPHONE HCL PF 1 MG/ML IJ SOLN
1.0000 mg | Freq: Once | INTRAMUSCULAR | Status: AC
  Administered 2013-08-11: 1 mg via INTRAVENOUS
  Filled 2013-08-11: qty 1

## 2013-08-11 NOTE — ED Notes (Signed)
Sunny Schlein (mother) [phone]

## 2013-08-11 NOTE — ED Provider Notes (Signed)
CSN: [id number]     Arrival date & time 08/11/13  1253 History   First MD Initiated Contact with Patient 08/11/13 1259     Chief Complaint  Patient presents with  . Flank Pain   (Consider location/radiation/quality/duration/timing/severity/associated sxs/prior Treatment) HPI  This a 32 year old female with history of positive for diabetes who presents with fever, bilateral flank pain. Patient reports 3 days of symptoms. She states "my kidneys aren't good." She reports decreased by mouth intake and vomiting that was nonbilious, nonbloody. Patient reports sharp stabbing bilateral back pain. She states that this feels like when "my kidneys were bad."  She denies any dysuria or hematuria. She reports tactile fevers at home and her temperature in triage was 100.3. Patient reports her pain as 20 out of 10. Patient also reports epigastric abdominal pain which is achy and nonradiating. When asked if she could be pregnant, the patient states "no." However, she is sexually active and not currently on birth control.  Past Medical History  Diagnosis Date  . Ectopic pregnancy     2007   History reviewed. No pertinent past surgical history. History reviewed. No pertinent family history. History  Substance Use Topics  . Smoking status: Former Smoker    Quit date: 05/28/2013  . Smokeless tobacco: Never Used  . Alcohol Use: No   OB History   Grav Para Term Preterm Abortions TAB SAB Ect Mult Living                 Review of Systems  Constitutional: Positive for fever and chills.  Respiratory: Negative for cough, chest tightness and shortness of breath.   Cardiovascular: Negative for chest pain.  Gastrointestinal: Positive for nausea, vomiting and abdominal pain.  Genitourinary: Positive for flank pain. Negative for dysuria and hematuria.  Musculoskeletal: Positive for back pain.  Skin: Negative for wound.  Neurological: Negative for headaches.  Psychiatric/Behavioral: Negative for confusion.   All other systems reviewed and are negative.    Allergies  Review of patient's allergies indicates no known allergies.  Home Medications  No current outpatient prescriptions on file. BP 118/75  Pulse 84  Temp(Src) 99.2 F (37.3 C) (Oral)  Resp 13  Ht 4\' 11"  (1.499 m)  Wt 109 lb 12.6 oz (49.8 kg)  BMI 22.16 kg/m2  SpO2 100% Physical Exam  Nursing note and vitals reviewed. Constitutional: She is oriented to person, place, and time.  Ill-appearing but nontoxic  HENT:  Head: Normocephalic and atraumatic.  Eyes: Pupils are equal, round, and reactive to light.  Neck: Neck supple.  Cardiovascular: Regular rhythm and normal heart sounds.   Tachycardia  Pulmonary/Chest: Effort normal and breath sounds normal. No respiratory distress. She has no wheezes.  Abdominal: Soft. Bowel sounds are normal. There is no tenderness.  Right greater the left CVA tenderness  Musculoskeletal: She exhibits no edema.  Neurological: She is alert and oriented to person, place, and time.  Skin: Skin is warm and dry.  Psychiatric: She has a normal mood and affect.    ED Course  Procedures (including critical care time) Labs Review Labs Reviewed  CBC WITH DIFFERENTIAL - Abnormal; Notable for the following:    WBC 14.0 (*)    Hemoglobin 11.3 (*)    HCT 34.1 (*)    MCV 77.7 (*)    MCH 25.7 (*)    RDW 19.5 (*)    Neutro Abs 10.2 (*)    Monocytes Absolute 1.6 (*)    All other components within normal limits  COMPREHENSIVE METABOLIC PANEL - Abnormal; Notable for the following:    Sodium 131 (*)    Potassium 3.4 (*)    Chloride 94 (*)    Total Protein 8.5 (*)    All other components within normal limits  URINALYSIS, ROUTINE W REFLEX MICROSCOPIC - Abnormal; Notable for the following:    Color, Urine AMBER (*)    APPearance CLOUDY (*)    Hgb urine dipstick LARGE (*)    Ketones, ur 40 (*)    Protein, ur 100 (*)    Urobilinogen, UA 2.0 (*)    Nitrite POSITIVE (*)    Leukocytes, UA LARGE (*)     All other components within normal limits  URINE MICROSCOPIC-ADD ON - Abnormal; Notable for the following:    Bacteria, UA FEW (*)    All other components within normal limits  URINE RAPID DRUG SCREEN (HOSP PERFORMED) - Abnormal; Notable for the following:    Tetrahydrocannabinol POSITIVE (*)    All other components within normal limits  URINE CULTURE  CULTURE, BLOOD (ROUTINE X 2)  CULTURE, BLOOD (ROUTINE X 2)  MRSA PCR SCREENING  PROCALCITONIN  ETHANOL  ETHANOL  COMPREHENSIVE METABOLIC PANEL  MAGNESIUM  PHOSPHORUS  PROTIME-INR  CBC WITH DIFFERENTIAL  APTT  TSH  COMPREHENSIVE METABOLIC PANEL  CBC  URINE RAPID DRUG SCREEN (HOSP PERFORMED)  POCT PREGNANCY, URINE   Imaging Review No results found.  MDM   1. Pyelonephritis   2. Anemia   3. Hypokalemia   4. Hyponatremia    Patient presents with n/v and back pain.  Hypotensive and tachycardic on initial eval.  Leukocytosis and UTI noted.  Patient will be admitted for IV hydration and antibiotics for pyelonephritis.    Shon Baton, MD 08/11/13 1800

## 2013-08-11 NOTE — H&P (Signed)
[facility] History and Physical  Marcia Richardson AVW:[id number] DOB: December 27, 1980 DOA: 08/11/2013  Referring physician: ER physician PCP: No primary provider on file.   Chief Complaint: flank pain  HPI:  32 year old female with no significant past medical history who presented to [facility] ED 08/11/2013 with complaints of worsening back pain for past few days prior to this admission associated with nausea, vomiting, poor oral intake and fevers/chills. Patient reported back pain in flank area bilaterally, 10/10 in intensity and non radiating, sharp and intermittent. She had some burning sensation on urination but no blood in urine. She did not try pain medications at home. No chest pain, no shortness of breath. No blood in stool or urine. No abnormal vaginal discharge. No lightheadedness of loss of consciousness. In ED, her vitals are as follows: BP 88/63 and HR 104, T 100.3 F. Her oxygen saturation was 98% on room air. She was given 1 L normal saline bolus. She was given IV rocephin in ED. CBC revealed WBC count of 14 and hemoglobin of 11.3. BMP revealed sodium of 131 and potassium of 3.4. CT abd was ordered but it has not been done yet. Due to possible concern for sepsis patient was admitted to stepdown unit for first 24 hour observation.  Assessment and Plan:  Principal Problem:   Acute Pyelonephritis - possible sepsis as BP low at 88/63 and T 100.3 with leukocytosis of 14 - pt however is not on pressure support at this time - check procalcitonin level - will continue IV fluids at 75 cc /hr but will give additional bolus of IV normal saline - urinalysis with large leukocyte esterases - follow up urine culture results - continue rocephin IV Q 24 hours - follow up CT abdomen results ordered in ED - continue prn antiemetics - diet as tolerated  Active Problems:   Leukocytosis - likely due to pyelonephritis - follow up urine culture results - follow up blood culture results - continue IV  Rocephin    Anemia  - hemoglobin 11.3 on admission - no signs of bleed - no indications for transfusion   Hypokalemia - secondary to GI losses - repleted in ED - follow up BMP in am   Hyponatremia - secondary to dehydration - continue IV fluids - follow up sodium level in am  Radiological Exams on Admission: No results found.   Code Status: Full Family Communication: Pt at bedside Disposition Plan: Admit for further evaluation  Manson Passey, MD  [facility] Pager [phone]  Review of Systems:  Constitutional: positive for fever, chills and malaise/fatigue. Negative for diaphoresis.  HENT: Negative for hearing loss, ear pain, nosebleeds, congestion, sore throat, neck pain, tinnitus and ear discharge.   Eyes: Negative for blurred vision, double vision, photophobia, pain, discharge and redness.  Respiratory: Negative for cough, hemoptysis, sputum production, shortness of breath, wheezing and stridor.   Cardiovascular: Negative for chest pain, palpitations, orthopnea, claudication and leg swelling.  Gastrointestinal: positive for nausea, vomiting and abdominal pain. Negative for heartburn, constipation, blood in stool and melena.  Genitourinary: per HPI.  Musculoskeletal: Negative for myalgias, back pain, joint pain and falls.  Skin: Negative for itching and rash.  Neurological: Negative for dizziness and weakness. Negative for tingling, tremors, sensory change, speech change, focal weakness, loss of consciousness and headaches.  Endo/Heme/Allergies: Negative for environmental allergies and polydipsia. Does not bruise/bleed easily.  Psychiatric/Behavioral: Negative for suicidal ideas. The patient is not nervous/anxious.      Past Medical History  Diagnosis Date  .  Ectopic pregnancy     2007   History reviewed. No pertinent past surgical history. Social History:  reports that she quit smoking about 2 months ago. She has never used smokeless tobacco. She reports that she  does not drink alcohol or use illicit drugs.  No Known Allergies  Family History: Family medical history significant for HTN, HLD   Prior to Admission medications   Not on File   Physical Exam: Filed Vitals:   08/11/13 1258 08/11/13 1333 08/11/13 1519 08/11/13 1705  BP: 88/63 88/39 96/57  109/71  Pulse: 104 98 99 88  Temp: 100.3 F (37.9 C)  99.6 F (37.6 C)   TempSrc: Oral  Oral   Resp: 20 20 18 16   SpO2: 100% 98% 99% 100%    Physical Exam  Constitutional: Appears well-developed and well-nourished. No distress.  HENT: Normocephalic. External right and left ear normal. Oropharynx is clear and moist.  Eyes: Conjunctivae and EOM are normal. PERRLA, no scleral icterus.  Neck: Normal ROM. Neck supple. No JVD. No tracheal deviation. No thyromegaly.  CVS: RRR, S1/S2 +, no murmurs, no gallops, no carotid bruit.  Pulmonary: Effort and breath sounds normal, no stridor, rhonchi, wheezes, rales.  Abdominal: Soft. BS +,  no distension, rebound or guarding. CVA tenderness more on the right side  Musculoskeletal: Normal range of motion. No edema and no tenderness.  Lymphadenopathy: No lymphadenopathy noted, cervical, inguinal. Neuro: Alert. Normal reflexes, muscle tone coordination. No cranial nerve deficit. Skin: Skin is warm and dry. No rash noted. Not diaphoretic. No erythema. No pallor.  Psychiatric: Normal mood and affect. Behavior, judgment, thought content normal.   Labs on Admission:  Basic Metabolic Panel:  Recent Labs Lab 08/11/13 1350  NA 131*  K 3.4*  CL 94*  CO2 20  GLUCOSE 95  BUN 9  CREATININE 0.69  CALCIUM 9.7   Liver Function Tests:  Recent Labs Lab 08/11/13 1350  AST 14  ALT 9  ALKPHOS 75  BILITOT 1.1  PROT 8.5*  ALBUMIN 4.0   No results found for this basename: LIPASE, AMYLASE,  in the last 168 hours No results found for this basename: AMMONIA,  in the last 168 hours CBC:  Recent Labs Lab 08/11/13 1350  WBC 14.0*  NEUTROABS 10.2*  HGB 11.3*   HCT 34.1*  MCV 77.7*  PLT 288   Cardiac Enzymes: No results found for this basename: CKTOTAL, CKMB, CKMBINDEX, TROPONINI,  in the last 168 hours BNP: No components found with this basename: POCBNP,  CBG: No results found for this basename: GLUCAP,  in the last 168 hours  If 7PM-7AM, please contact night-coverage [email or web address] Password [facility] 08/11/2013, 5:33 PM

## 2013-08-11 NOTE — ED Notes (Signed)
Per EMS pt complaining of bil flank/back pain x 3 days, sharp pain, dizziness, nausea/vomiting, tenderness in abdomen, BP 108/71, HR 94, 95% RA.

## 2013-08-11 NOTE — Progress Notes (Signed)
[facility] CL provided pt with a list of primary care resources and a [facility] [employer] application. Patient and visitor were both very agitated. Patient wanted to know if there was anyway we could help her with Medicaid. CL told patient that there was not anyone on staff who could assist her with Medicaid, that she would need to go to Department of Social Services to apply. Patient was still very agitated and stated that she had filled all that paperwork before. CL asked if she was talking about Medicaid or [facility] [employer]. Patient stated "that information there", pointing to my clipboard. Explained to patient that I was just going to give her resources that will help her get set up with a PCP, Primary care resources and [facility] [employer]. Visitor wanted to know how patient was going to pay for that, explained that co-pay with the [facility] Card was given to patient upon enrolling. Patient and visitor were still very agitated and visitor told me to just leave the information.

## 2013-08-11 NOTE — Progress Notes (Signed)
Utilization Review completed.  Katrianna Friesenhahn RN CM  

## 2013-08-11 NOTE — ED Notes (Signed)
Bed: JY78 Expected date:  Expected time:  Means of arrival:  Comments: Bilateral flank pain

## 2013-08-12 DIAGNOSIS — A419 Sepsis, unspecified organism: Secondary | ICD-10-CM

## 2013-08-12 DIAGNOSIS — N920 Excessive and frequent menstruation with regular cycle: Secondary | ICD-10-CM | POA: Diagnosis present

## 2013-08-12 LAB — LACTATE DEHYDROGENASE: LDH: 123 U/L (ref 94–250)

## 2013-08-12 LAB — URINE CULTURE: Colony Count: >=100000

## 2013-08-12 LAB — COMPREHENSIVE METABOLIC PANEL
ALT: 7 U/L (ref 0–35)
BUN: 6 mg/dL (ref 6–23)
CO2: 22 mEq/L (ref 19–32)
Calcium: 8.8 mg/dL (ref 8.4–10.5)
Chloride: 99 mEq/L (ref 96–112)
Creatinine, Ser: 0.56 mg/dL (ref 0.50–1.10)
GFR calc Af Amer: >90 mL/min (ref >90)
GFR calc non Af Amer: >90 mL/min (ref >90)
Glucose, Bld: 87 mg/dL (ref 70–99)
Sodium: 132 mEq/L — ABNORMAL LOW (ref 135–145)
Total Bilirubin: 0.6 mg/dL (ref 0.3–1.2)
Total Protein: 6.9 g/dL (ref 6.0–8.3)

## 2013-08-12 LAB — IRON AND TIBC
Iron: <10 ug/dL — ABNORMAL LOW (ref 42–135)
UIBC: 325 ug/dL (ref 125–400)

## 2013-08-12 LAB — CBC
Hemoglobin: 9.3 g/dL — ABNORMAL LOW (ref 12.0–15.0)
MCH: 25.4 pg — ABNORMAL LOW (ref 26.0–34.0)
MCHC: 32.5 g/dL (ref 30.0–36.0)
RBC: 3.66 MIL/uL — ABNORMAL LOW (ref 3.87–5.11)
WBC: 9.9 10*3/uL (ref 4.0–10.5)

## 2013-08-12 LAB — GLUCOSE, CAPILLARY: Glucose-Capillary: 82 mg/dL (ref 70–99)

## 2013-08-12 LAB — BILIRUBIN, FRACTIONATED(TOT/DIR/INDIR): Bilirubin, Direct: 0.1 mg/dL (ref 0.0–0.3)

## 2013-08-12 LAB — RETICULOCYTES
RBC.: 3.55 MIL/uL — ABNORMAL LOW (ref 3.87–5.11)
Retic Ct Pct: 1.4 % (ref 0.4–3.1)

## 2013-08-12 LAB — DIRECT ANTIGLOBULIN TEST (NOT AT ARMC)
DAT, IgG: NEGATIVE
DAT, complement: NEGATIVE

## 2013-08-12 LAB — FERRITIN: Ferritin: 74 ng/mL (ref 10–291)

## 2013-08-12 MED ORDER — KETOROLAC TROMETHAMINE 30 MG/ML IJ SOLN
30.0000 mg | Freq: Once | INTRAMUSCULAR | Status: AC
  Administered 2013-08-12: 30 mg via INTRAVENOUS
  Filled 2013-08-12: qty 1

## 2013-08-12 MED ORDER — CIPROFLOXACIN HCL 500 MG PO TABS
500.0000 mg | ORAL_TABLET | Freq: Two times a day (BID) | ORAL | Status: DC
  Administered 2013-08-12 – 2013-08-13 (×3): 500 mg via ORAL
  Filled 2013-08-12 (×5): qty 1

## 2013-08-12 MED ORDER — SODIUM CHLORIDE 0.9 % IV BOLUS (SEPSIS)
1000.0000 mL | Freq: Once | INTRAVENOUS | Status: AC
  Administered 2013-08-12: 1000 mL via INTRAVENOUS

## 2013-08-12 MED ORDER — ONDANSETRON HCL 4 MG/2ML IJ SOLN
4.0000 mg | Freq: Three times a day (TID) | INTRAMUSCULAR | Status: DC | PRN
  Administered 2013-08-12: 4 mg via INTRAVENOUS
  Filled 2013-08-12: qty 2

## 2013-08-12 NOTE — Progress Notes (Signed)
INITIAL NUTRITION ASSESSMENT  DOCUMENTATION CODES Per approved criteria  -Not Applicable   INTERVENTION: - Diet advancement per MD - Will continue to monitor   NUTRITION DIAGNOSIS: Inadequate oral intake related to inability to eat as evidenced by NPO.   Goal: Advance diet as tolerated to regular diet  Monitor:  Weights, labs, diet advancement   Reason for Assessment: Nutrition risk   32 y.o. female  Admitting Dx: Pyelonephritis  ASSESSMENT: Pt admitted with flank pain with nausea/vomiting x 3 days, found to have acute pyelonephritis. Met with pt who reports she has not been able to eat anything for the past 3 days. Reports she would throw up twice/day. Reports before then she was eating better, 3 meals/day, but states her appetite was down r/t stress. Has lost 3 pounds unintentionally in the past 3 months. C/o nausea and headache, notified RN. Pt with low potassium, getting oral replacement.   Height: Ht Readings from Last 1 Encounters:  08/11/13 4\' 11"  (1.499 m)    Weight: Wt Readings from Last 1 Encounters:  08/11/13 109 lb 12.6 oz (49.8 kg)    Ideal Body Weight: 98 lb   % Ideal Body Weight: 111%  Wt Readings from Last 10 Encounters:  08/11/13 109 lb 12.6 oz (49.8 kg)  06/03/13 112 lb 14 oz (51.2 kg)    Usual Body Weight: 112 lb in July 2014  % Usual Body Weight: 97%  BMI:  Body mass index is 22.16 kg/(m^2).  Estimated Nutritional Needs: Kcal: 1250-1400 Protein: 50-60g Fluid: 1.2-1.4L/day  Skin: Intact   Diet Order: NPO  EDUCATION NEEDS: -No education needs identified at this time   Intake/Output Summary (Last 24 hours) at 08/12/13 0947 Last data filed at 08/12/13 0800  Gross per 24 hour  Intake 2072.5 ml  Output   1750 ml  Net  322.5 ml    Last BM: 10/7  Labs:   Recent Labs Lab 08/11/13 1350 08/11/13 1847 08/12/13 0335  NA 131* 130* 132*  K 3.4* 3.4* 3.4*  CL 94* 93* 99  CO2 20 23 22   BUN 9 7 6   CREATININE 0.69 0.64 0.56   CALCIUM 9.7 9.2 8.8  MG  --  1.9  --   PHOS  --  2.9  --   GLUCOSE 95 81 87    CBG (last 3)   Recent Labs  08/12/13 0749  GLUCAP 82    Scheduled Meds: . ciprofloxacin  500 mg Oral BID  . ketorolac  30 mg Intravenous Once  . potassium chloride  40 mEq Oral Once  . sodium chloride  1,000 mL Intravenous Once  . sodium chloride  3 mL Intravenous Q12H    Continuous Infusions: . sodium chloride 75 mL/hr (08/12/13 0353)  . sodium chloride 9,999 mL/hr at 08/11/13 1630    Past Medical History  Diagnosis Date  . Ectopic pregnancy     2007    History reviewed. No pertinent past surgical history.   Levon Hedger MS, RD, LDN [phone] Pager [phone] After Hours Pager

## 2013-08-12 NOTE — Progress Notes (Signed)
[facility] PROGRESS NOTE  Marcia Richardson YNW:[id number] DOB: 11/23/1980 DOA: 08/11/2013 PCP: No primary provider on file.  Assessment/Plan: 1. Sepsis; patient might guidelines for sepsis. Patient's WBC, temp, tachypnea, tachycardia has resolved with fluids and antibiotic treatment. We'll DC ceftriaxone and start patient on ciprofloxacin PO.  -Continues to have waxing/waning CVA tenderness will monitor. Obtain a.m. CBC/CMP   2. Pyelonephritis; urine cultures pending see 1  3. Hyponatremia; improving but still slightly hyponatremic. Increase normal saline to 136ml/hr   4. Hypokalemia; slightly hypokalemic which is monitored this point  5. Anemia; patient's baseline is 9.0, currently at baseline -Begin anemia workup  6. Migraine; Toradol 30 mg + bolus 1 L normal saline   Code Status: Full Disposition Plan: Discharge when patient able tolerate by mouth food/medication   Consultants:    Procedures: Blood culture; no growth will be held for 5 days until a final negative  Urine culture; pending  Antibiotics:  Ceftriaxone 10/8>>D/Ced 10/8  Cipro 10/8>>  HPI/Subjective: 32 yo BF PMHx  pyelonephritis, menorrhagia, hypokalemia past medical history who presented to [facility] ED 08/11/2013 with complaints of worsening back pain for past few days prior to this admission associated with nausea, vomiting, poor oral intake and fevers/chills. Patient reported back pain in flank area bilaterally, 10/10 in intensity and non radiating, sharp and intermittent. She had some burning sensation on urination but no blood in urine. She did not try pain medications at home. No chest pain, no shortness of breath. No blood in stool or urine. No abnormal vaginal discharge. No lightheadedness of loss of consciousness.  In ED, her vitals are as follows: BP 88/63 and HR 104, T 100.3 F. Her oxygen saturation was 98% on room air. She was given 1 L normal saline bolus. She was given IV rocephin in ED. CBC  revealed WBC count of 14 and hemoglobin of 11.3. BMP revealed sodium of 131 and potassium of 3.4. CT abd; see below results. TODAY continues to have CVA pain however is not waxing and waning currently rates 7/10. Also states is having a headache the symptoms are consistent with a migraine     Procedure  08/11/2013 CT abdomen/pelvis with contrast  Bilateral right worse than left pyelonephritis.  Right-sided benign follicular cysts.    Objective: Filed Vitals:   08/12/13 0356 08/12/13 0400 08/12/13 0500 08/12/13 0600  BP:  108/74 104/70 108/65  Pulse: 90 82 96 83  Temp: 97.5 F (36.4 C)     TempSrc: Axillary     Resp: 11 11 13    Height:      Weight:      SpO2: 100% 96% 97% 98%    Intake/Output Summary (Last 24 hours) at 08/12/13 0728 Last data filed at 08/12/13 0600  Gross per 24 hour  Intake 1922.5 ml  Output   1350 ml  Net  572.5 ml   Filed Weights   08/11/13 1740  Weight: 49.8 kg (109 lb 12.6 oz)    Exam:   General: A./O. x4, mild distress secondary to headache and waxing and waning CVA tenderness  Cardiovascular: Regular rhythm and rate, negative murmurs rubs gallops  Respiratory: Clear to auscultation bilateral  Abdomen: Soft mildly tender (suprapubic), plus bowel sounds  Musculoskeletal: Negative pedal edema, moderate bilateral CVA tenderness to palpation   Data Reviewed: Basic Metabolic Panel:  Recent Labs Lab 08/11/13 1350 08/11/13 1847 08/12/13 0335  NA 131* 130* 132*  K 3.4* 3.4* 3.4*  CL 94* 93* 99  CO2 20 23 22   GLUCOSE 95  81 87  BUN 9 7 6   CREATININE 0.69 0.64 0.56  CALCIUM 9.7 9.2 8.8  MG  --  1.9  --   PHOS  --  2.9  --    Liver Function Tests:  Recent Labs Lab 08/11/13 1350 08/11/13 1847 08/12/13 0335  AST 14 14 11   ALT 9 8 7   ALKPHOS 75 71 60  BILITOT 1.1 0.7 0.6  PROT 8.5* 7.9 6.9  ALBUMIN 4.0 3.7 3.1*   No results found for this basename: LIPASE, AMYLASE,  in the last 168 hours No results found for this basename:  AMMONIA,  in the last 168 hours CBC:  Recent Labs Lab 08/11/13 1350 08/11/13 1847 08/12/13 0335  WBC 14.0* 13.1* 9.9  NEUTROABS 10.2* 9.1*  --   HGB 11.3* 10.4* 9.3*  HCT 34.1* 32.1* 28.6*  MCV 77.7* 78.3 78.1  PLT 288 283 253   Cardiac Enzymes: No results found for this basename: CKTOTAL, CKMB, CKMBINDEX, TROPONINI,  in the last 168 hours BNP (last 3 results) No results found for this basename: PROBNP,  in the last 8760 hours CBG: No results found for this basename: GLUCAP,  in the last 168 hours  Recent Results (from the past 240 hour(s))  MRSA PCR SCREENING     Status: None   Collection Time    08/11/13  5:55 PM      Result Value Range Status   MRSA by PCR NEGATIVE  NEGATIVE Final   Comment:            The GeneXpert MRSA Assay (FDA     approved for NASAL specimens     only), is one component of a     comprehensive MRSA colonization     surveillance program. It is not     intended to diagnose MRSA     infection nor to guide or     monitor treatment for     MRSA infections.     Studies: Ct Abdomen Pelvis W Contrast  08/11/2013   CLINICAL DATA:  Bilateral flank pain x3 days  EXAM: CT ABDOMEN AND PELVIS WITH CONTRAST  TECHNIQUE: Multidetector CT imaging of the abdomen and pelvis was performed using the standard protocol following bolus administration of intravenous contrast.  CONTRAST:  80mL OMNIPAQUE IOHEXOL 300 MG/ML  SOLN  COMPARISON:  Prior pelvic ultrasound 06/04/2013; prior CT abdomen/pelvis 06/03/2013  FINDINGS: Lower Chest: The lung bases are clear. Visualized cardiac structures are within normal limits for size. No pericardial effusion. Unremarkable visualized distal thoracic esophagus.  Abdomen: Unremarkable CT appearance of the stomach, duodenum, spleen, adrenal glands and pancreas. Normal hepatic contours and morphology. No focal hepatic lesion. Gallbladder is unremarkable. No intra or extrahepatic biliary ductal dilatation.  Abnormal appearance of the kidneys  with a striated nephrogram on the right with multifocal areas of patchy hypoattenuation in the upper, in the interpolar kidney. The lower pole is relatively spared. More subtle findings are noted in the left kidney which do not fully extend to the renal cortex. No hydronephrosis or nephrolithiasis.  No evidence of obstruction or focal bowel wall thickening. Normal appendix in the right lower quadrant. The terminal ileum is unremarkable. No free fluid or suspicious adenopathy.  Pelvis: Unremarkable latter, uterus and left adnexa. 2.9 cm water attenuation cyst affiliated with the right ovary most consistent with a prominent follicular cyst. No free fluid or suspicious adenopathy.  Bones/Soft Tissues: No acute fracture or aggressive appearing lytic or blastic osseous lesion.  Vascular: No significant atherosclerotic vascular  disease, aneurysmal dilatation or acute abnormality.  IMPRESSION: Bilateral right worse than left pyelonephritis.  Right-sided benign follicular cysts.   Electronically Signed   By: Malachy Moan M.D.   On: 08/11/2013 18:29    Scheduled Meds: . cefTRIAXone (ROCEPHIN)  IV  1 g Intravenous Q24H  . potassium chloride  40 mEq Oral Once  . sodium chloride  3 mL Intravenous Q12H   Continuous Infusions: . sodium chloride 75 mL/hr (08/12/13 0353)  . sodium chloride 9,999 mL/hr at 08/11/13 1630    Principal Problem:   Pyelonephritis Active Problems:   Hypotension   Sepsis   Leukocytosis   Anemia   Hypokalemia   Hyponatremia    Time spent: 35 minutes   Lille Karim, J  [facility] Pager [phone]. If 7PM-7AM, please contact night-coverage at [email or web address], password [facility] 08/12/2013, 7:28 AM  LOS: 1 day

## 2013-08-13 DIAGNOSIS — D72829 Elevated white blood cell count, unspecified: Secondary | ICD-10-CM

## 2013-08-13 DIAGNOSIS — N920 Excessive and frequent menstruation with regular cycle: Secondary | ICD-10-CM

## 2013-08-13 LAB — CBC WITH DIFFERENTIAL/PLATELET
Basophils Absolute: 0 10*3/uL (ref 0.0–0.1)
Basophils Relative: 0 % (ref 0–1)
Eosinophils Absolute: 0.2 10*3/uL (ref 0.0–0.7)
Eosinophils Relative: 3 % (ref 0–5)
Lymphocytes Relative: 41 % (ref 12–46)
MCH: 25.5 pg — ABNORMAL LOW (ref 26.0–34.0)
MCV: 79 fL (ref 78.0–100.0)
Neutrophils Relative %: 42 % — ABNORMAL LOW (ref 43–77)
Platelets: 265 10*3/uL (ref 150–400)
RDW: 19.7 % — ABNORMAL HIGH (ref 11.5–15.5)
WBC: 6 10*3/uL (ref 4.0–10.5)

## 2013-08-13 LAB — COMPREHENSIVE METABOLIC PANEL
ALT: 9 U/L (ref 0–35)
AST: 14 U/L (ref 0–37)
Albumin: 2.4 g/dL — ABNORMAL LOW (ref 3.5–5.2)
Alkaline Phosphatase: 54 U/L (ref 39–117)
BUN: 7 mg/dL (ref 6–23)
CO2: 23 mEq/L (ref 19–32)
Chloride: 105 mEq/L (ref 96–112)
Creatinine, Ser: 0.57 mg/dL (ref 0.50–1.10)
GFR calc non Af Amer: >90 mL/min (ref >90)
Potassium: 3.8 mEq/L (ref 3.5–5.1)
Sodium: 136 mEq/L (ref 135–145)
Total Bilirubin: 0.3 mg/dL (ref 0.3–1.2)

## 2013-08-13 LAB — GLUCOSE, CAPILLARY: Glucose-Capillary: 75 mg/dL (ref 70–99)

## 2013-08-13 LAB — FOLATE RBC: RBC Folate: 516 ng/mL (ref >=366)

## 2013-08-13 MED ORDER — FERROUS SULFATE 325 (65 FE) MG PO TABS
325.0000 mg | ORAL_TABLET | Freq: Every day | ORAL | Status: DC
  Administered 2013-08-13: 325 mg via ORAL
  Filled 2013-08-13 (×2): qty 1

## 2013-08-13 MED ORDER — VITAMIN C 500 MG PO TABS
500.0000 mg | ORAL_TABLET | Freq: Two times a day (BID) | ORAL | Status: DC
  Administered 2013-08-13: 500 mg via ORAL
  Filled 2013-08-13 (×2): qty 1

## 2013-08-13 MED ORDER — FERROUS SULFATE 325 (65 FE) MG PO TABS: 325.0000 mg | ORAL_TABLET | Freq: Every day | ORAL | Status: DC

## 2013-08-13 MED ORDER — ASCORBIC ACID 500 MG PO TABS: 500.0000 mg | ORAL_TABLET | Freq: Two times a day (BID) | ORAL | Status: DC

## 2013-08-13 MED ORDER — CIPROFLOXACIN HCL 500 MG PO TABS: 500.0000 mg | ORAL_TABLET | Freq: Two times a day (BID) | ORAL | Status: DC

## 2013-08-13 MED ORDER — VITAMIN C 500 MG PO TABS: 500.0000 mg | ORAL_TABLET | Freq: Two times a day (BID) | ORAL | Status: DC

## 2013-08-13 NOTE — Progress Notes (Signed)
Patient alert and oriented; eager for discharge.  While reviewing discharge education, patient expressed questions about why she had a reoccurrence of pyelonephritis. Dr. Joseph Art was paged and spoke with patient over the phone to resolve her questions.  Kinnie Feil, RN

## 2013-08-13 NOTE — Progress Notes (Signed)
CARE MANAGEMENT NOTE 08/13/2013  Patient:  Marcia Richardson, Marcia Richardson   Account Number:  [id number]  Date Initiated:  08/13/2013  Documentation initiated by:  DAVIS,RHONDA  Subjective/Objective Assessment:   pt with poss urosepsis     Action/Plan:   home   Anticipated DC Date:  08/13/2013   Anticipated DC Plan:  HOME/SELF CARE  In-house referral  NA      DC Planning Services  CM consult      PAC Choice  NA   Choice offered to / List presented to:  NA   DME arranged  NA      DME agency  NA     HH arranged  NA      HH agency  NA   Status of service:  Completed, signed off Medicare Important Message given?  NA - LOS <3 / Initial given by admissions (If response is "NO", the following Medicare IM given date fields will be blank) Date Medicare IM given:   Date Additional Medicare IM given:    Discharge Disposition:  HOME/SELF CARE  Per UR Regulation:  Reviewed for med. necessity/level of care/duration of stay  If discussed at Long Length of Stay Meetings, dates discussed:    Comments:  [id number]/Rhonda Stark Jock, BSN, [place] [phone] Chart Reviewed for discharge and hospital needs. Discharge needs at time of review:  Information for appointment to be made for follow up appt. through the [facility].  email sent to [facility] [employer] who will contact the patient. Review of patient progress due on [id number].

## 2013-08-13 NOTE — Discharge Summary (Signed)
Physician Discharge Summary  Marcia Richardson WGN:[id number] DOB: [birth year] DOA: 08/11/2013  PCP: No primary provider on file.  Admit date: 08/11/2013 Discharge date: 08/13/2013  Time spent: 40 minutes  Recommendations for Outpatient Follow-up:   1. Acute Pyelonephritis  -Urine culture positive for Escherichia coli  - Escherichia coli sensitive to ciprofloxacin we'll treat for total of 14 days   2. Leukocytosis  - resolved - follow up urine culture; see results below - follow up blood culture see results below    3. Anemia  - hemoglobin 11.3 on admission  - Chronic, most likely secondary to menorrhagia start patient on iron + vitamin C  -Patient to see PCP in approximately 6 weeks for followup on treatment   4.Hypokalemia  - Resolved    5. Hyponatremia  - secondary to dehydration; resolved    Discharge Diagnoses:  Principal Problem:   Pyelonephritis Active Problems:   Hypotension   Sepsis   Leukocytosis   Anemia   Hypokalemia   Hyponatremia   Menorrhagia   Discharge Condition: Stable  Diet recommendation: Normal  Filed Weights   08/11/13 1740  Weight: 49.8 kg (109 lb 12.6 oz)    History of present illness:  32 yo BF PMHx menorrhagia, anemia. Presented to [facility] ED 08/11/2013 with complaints of worsening back pain for past few days prior to this admission associated with nausea, vomiting, poor oral intake and fevers/chills. Patient reported back pain in flank area bilaterally, 10/10 in intensity and non radiating, sharp and intermittent. She had some burning sensation on urination but no blood in urine. She did not try pain medications at home. No chest pain, no shortness of breath. No blood in stool or urine. No abnormal vaginal discharge. No lightheadedness of loss of consciousness.  In ED, her vitals are as follows: BP 88/63 and HR 104, T 100.3 F. Her oxygen saturation was 98% on room air. She was given 1 L normal saline bolus. She was given IV rocephin in ED. CBC  revealed WBC count of 14 and hemoglobin of 11.3. BMP revealed sodium of 131 and potassium of 3.4. CT abd was ordered but it has not been done yet. Due to possible concern for sepsis patient was admitted to stepdown unit for first 24 hour observation. TODAY states feels greatly improved, negative pain on urination, negative abdominal pain, negative back pain. Request to be discharged     Hospital Course:  Patient initially treated with Rocephin for urosepsis, and then transitioned to ciprofloxacin for Escherichia coli. Patient's leukocytosis and constitutional symptoms are resolved    Procedure Blood Culture 08/11/2013; BLOOD CULTURE RECEIVED NO GROWTH TO DATE CULTURE WILL BE HELD FOR 5 DAYS BEFORE ISSUING A FINAL NEGATIVE   Urine Culture 08/11/2013; E. Coli   CT abdomen pelvis with contrast 08/11/2013 Bilateral right worse than left pyelonephritis.  Right-sided benign follicular cysts.    Antibiotics:  Ceftriaxone 10/8 >>> DC'd 10/8  Ciprofloxacin 10/8>>  Discharge Exam: Filed Vitals:   08/12/13 2000 08/12/13 2353 08/13/13 0306 08/13/13 0717  BP:  97/63 99/62   Pulse:   56   Temp: 98 F (36.7 C) 98.1 F (36.7 C) 98 F (36.7 C) 98.1 F (36.7 C)  TempSrc: Oral Oral Oral Oral  Resp:  13 11   Height:      Weight:      SpO2:  100% 100%     General: A./O. x4,NAD Cardiovascular: Regular rhythm and rate, negative murmurs rubs gallops, DP/PT pulse 2+ bilateral Respiratory: Clear to auscultation bilateral Musculoskeletal;  negative abdominal pain, negative right CVA pain, mild left CVA pain to palpation   Discharge Instructions     Medication List    Notice   You have not been prescribed any medications.     No Known Allergies    The results of significant diagnostics from this hospitalization (including imaging, microbiology, ancillary and laboratory) are listed below for reference.    Significant Diagnostic Studies: Ct Abdomen Pelvis W Contrast  08/11/2013    CLINICAL DATA:  Bilateral flank pain x3 days  EXAM: CT ABDOMEN AND PELVIS WITH CONTRAST  TECHNIQUE: Multidetector CT imaging of the abdomen and pelvis was performed using the standard protocol following bolus administration of intravenous contrast.  CONTRAST:  80mL OMNIPAQUE IOHEXOL 300 MG/ML  SOLN  COMPARISON:  Prior pelvic ultrasound 06/04/2013; prior CT abdomen/pelvis 06/03/2013  FINDINGS: Lower Chest: The lung bases are clear. Visualized cardiac structures are within normal limits for size. No pericardial effusion. Unremarkable visualized distal thoracic esophagus.  Abdomen: Unremarkable CT appearance of the stomach, duodenum, spleen, adrenal glands and pancreas. Normal hepatic contours and morphology. No focal hepatic lesion. Gallbladder is unremarkable. No intra or extrahepatic biliary ductal dilatation.  Abnormal appearance of the kidneys with a striated nephrogram on the right with multifocal areas of patchy hypoattenuation in the upper, in the interpolar kidney. The lower pole is relatively spared. More subtle findings are noted in the left kidney which do not fully extend to the renal cortex. No hydronephrosis or nephrolithiasis.  No evidence of obstruction or focal bowel wall thickening. Normal appendix in the right lower quadrant. The terminal ileum is unremarkable. No free fluid or suspicious adenopathy.  Pelvis: Unremarkable latter, uterus and left adnexa. 2.9 cm water attenuation cyst affiliated with the right ovary most consistent with a prominent follicular cyst. No free fluid or suspicious adenopathy.  Bones/Soft Tissues: No acute fracture or aggressive appearing lytic or blastic osseous lesion.  Vascular: No significant atherosclerotic vascular disease, aneurysmal dilatation or acute abnormality.  IMPRESSION: Bilateral right worse than left pyelonephritis.  Right-sided benign follicular cysts.   Electronically Signed   By: Malachy Moan M.D.   On: 08/11/2013 18:29    Microbiology: Recent  Results (from the past 240 hour(s))  URINE CULTURE     Status: None   Collection Time    08/11/13  1:51 PM      Result Value Range Status   Specimen Description URINE, CLEAN CATCH   Final   Special Requests NONE   Final   Culture  Setup Time     Final   Value: 08/11/2013 22:00     Performed at [facility] Count     Final   Value: >=100,000 COLONIES/ML     Performed at [facility]   Culture     Final   Value: ESCHERICHIA COLI     Performed at [facility]   Report Status 08/12/2013 FINAL   Final   Organism ID, Bacteria ESCHERICHIA COLI   Final  CULTURE, BLOOD (ROUTINE X 2)     Status: None   Collection Time    08/11/13  4:30 PM      Result Value Range Status   Specimen Description BLOOD LEFT ANTECUBITAL   Final   Special Requests BOTTLES DRAWN AEROBIC AND ANAEROBIC 5CC   Final   Culture  Setup Time     Final   Value: 08/11/2013 21:54     Performed at [facility]  Final   Value:        BLOOD CULTURE RECEIVED NO GROWTH TO DATE CULTURE WILL BE HELD FOR 5 DAYS BEFORE ISSUING A FINAL NEGATIVE REPORT     Performed at [facility]   Report Status PENDING   Incomplete  CULTURE, BLOOD (ROUTINE X 2)     Status: None   Collection Time    08/11/13  4:30 PM      Result Value Range Status   Specimen Description BLOOD RIGHT ANTECUBITAL   Final   Special Requests BOTTLES DRAWN AEROBIC AND ANAEROBIC 5CC   Final   Culture  Setup Time     Final   Value: 08/11/2013 21:01     Performed at [facility]   Culture     Final   Value:        BLOOD CULTURE RECEIVED NO GROWTH TO DATE CULTURE WILL BE HELD FOR 5 DAYS BEFORE ISSUING A FINAL NEGATIVE REPORT     Performed at [facility]   Report Status PENDING   Incomplete  MRSA PCR SCREENING     Status: None   Collection Time    08/11/13  5:55 PM      Result Value Range Status   MRSA by PCR NEGATIVE  NEGATIVE Final   Comment:            The GeneXpert MRSA Assay (FDA      approved for NASAL specimens     only), is one component of a     comprehensive MRSA colonization     surveillance program. It is not     intended to diagnose MRSA     infection nor to guide or     monitor treatment for     MRSA infections.     Labs: Basic Metabolic Panel:  Recent Labs Lab 08/11/13 1350 08/11/13 1847 08/12/13 0335 08/13/13 0330  NA 131* 130* 132* 136  K 3.4* 3.4* 3.4* 3.8  CL 94* 93* 99 105  CO2 20 23 22 23   GLUCOSE 95 81 87 87  BUN 9 7 6 7   CREATININE 0.69 0.64 0.56 0.57  CALCIUM 9.7 9.2 8.8 8.2*  MG  --  1.9  --   --   PHOS  --  2.9  --   --    Liver Function Tests:  Recent Labs Lab 08/11/13 1350 08/11/13 1847 08/12/13 0335 08/12/13 1030 08/13/13 0330  AST 14 14 11   --  14  ALT 9 8 7   --  9  ALKPHOS 75 71 60  --  54  BILITOT 1.1 0.7 0.6 0.5 0.3  PROT 8.5* 7.9 6.9  --  5.5*  ALBUMIN 4.0 3.7 3.1*  --  2.4*   No results found for this basename: LIPASE, AMYLASE,  in the last 168 hours No results found for this basename: AMMONIA,  in the last 168 hours CBC:  Recent Labs Lab 08/11/13 1350 08/11/13 1847 08/12/13 0335 08/13/13 0330  WBC 14.0* 13.1* 9.9 6.0  NEUTROABS 10.2* 9.1*  --  2.5  HGB 11.3* 10.4* 9.3* 8.4*  HCT 34.1* 32.1* 28.6* 26.0*  MCV 77.7* 78.3 78.1 79.0  PLT 288 283 253 265   Cardiac Enzymes: No results found for this basename: CKTOTAL, CKMB, CKMBINDEX, TROPONINI,  in the last 168 hours BNP: BNP (last 3 results) No results found for this basename: PROBNP,  in the last 8760 hours CBG:  Recent Labs Lab 08/12/13 0749  GLUCAP 82  Signed:  Carolyne Littles, MD [facility] [phone] pager

## 2013-08-13 NOTE — Progress Notes (Signed)
Appointment request e-mail to the [facility] with request for follow appoint.  Number and demographics sent.

## 2013-08-13 NOTE — Progress Notes (Signed)
Patient did not have a local PCP.  Case Management worked with patient to make arrangements for her at the [facility] and [facility].

## 2013-08-17 LAB — CULTURE, BLOOD (ROUTINE X 2)
Culture: NO GROWTH
Culture: NO GROWTH

## 2013-08-21 ENCOUNTER — Telehealth: Payer: Self-pay | Admitting: General Practice

## 2013-08-21 NOTE — Telephone Encounter (Signed)
Needs HFU. Called and lm.

## 2014-08-12 ENCOUNTER — Encounter (HOSPITAL_COMMUNITY): Payer: Self-pay | Admitting: Emergency Medicine

## 2014-08-12 ENCOUNTER — Inpatient Hospital Stay (HOSPITAL_COMMUNITY)
Admission: EM | Admit: 2014-08-12 | Discharge: 2014-08-15 | DRG: 689 | Disposition: A | Discharge status: Home / Self Care | Payer: Self-pay | Attending: Internal Medicine | Admitting: Internal Medicine

## 2014-08-12 DIAGNOSIS — K5090 Crohn's disease, unspecified, without complications: Secondary | ICD-10-CM | POA: Diagnosis present

## 2014-08-12 DIAGNOSIS — A599 Trichomoniasis, unspecified: Secondary | ICD-10-CM

## 2014-08-12 DIAGNOSIS — R109 Unspecified abdominal pain: Secondary | ICD-10-CM

## 2014-08-12 DIAGNOSIS — N920 Excessive and frequent menstruation with regular cycle: Secondary | ICD-10-CM

## 2014-08-12 DIAGNOSIS — D72829 Elevated white blood cell count, unspecified: Secondary | ICD-10-CM | POA: Diagnosis present

## 2014-08-12 DIAGNOSIS — A419 Sepsis, unspecified organism: Secondary | ICD-10-CM | POA: Diagnosis present

## 2014-08-12 DIAGNOSIS — D509 Iron deficiency anemia, unspecified: Secondary | ICD-10-CM | POA: Diagnosis present

## 2014-08-12 DIAGNOSIS — R197 Diarrhea, unspecified: Secondary | ICD-10-CM | POA: Diagnosis present

## 2014-08-12 DIAGNOSIS — R52 Pain, unspecified: Secondary | ICD-10-CM

## 2014-08-12 DIAGNOSIS — E876 Hypokalemia: Secondary | ICD-10-CM | POA: Diagnosis present

## 2014-08-12 DIAGNOSIS — R1031 Right lower quadrant pain: Secondary | ICD-10-CM

## 2014-08-12 DIAGNOSIS — N12 Tubulo-interstitial nephritis, not specified as acute or chronic: Principal | ICD-10-CM | POA: Diagnosis present

## 2014-08-12 DIAGNOSIS — E871 Hypo-osmolality and hyponatremia: Secondary | ICD-10-CM

## 2014-08-12 DIAGNOSIS — R300 Dysuria: Secondary | ICD-10-CM | POA: Diagnosis present

## 2014-08-12 DIAGNOSIS — Z87891 Personal history of nicotine dependence: Secondary | ICD-10-CM

## 2014-08-12 LAB — BASIC METABOLIC PANEL
Anion gap: 14 (ref 5–15)
BUN: 13 mg/dL (ref 6–23)
CO2: 21 mEq/L (ref 19–32)
Calcium: 9.7 mg/dL (ref 8.4–10.5)
Chloride: 100 mEq/L (ref 96–112)
Creatinine, Ser: 0.68 mg/dL (ref 0.50–1.10)
GFR calc Af Amer: >90 mL/min (ref >90)
GFR calc non Af Amer: >90 mL/min (ref >90)
Glucose, Bld: 79 mg/dL (ref 70–99)
Potassium: 3.4 mEq/L — ABNORMAL LOW (ref 3.7–5.3)
Sodium: 135 mEq/L — ABNORMAL LOW (ref 137–147)

## 2014-08-12 LAB — URINE MICROSCOPIC-ADD ON

## 2014-08-12 LAB — URINALYSIS, ROUTINE W REFLEX MICROSCOPIC
Glucose, UA: NEGATIVE mg/dL
Ketones, ur: 15 mg/dL — AB
Nitrite: POSITIVE — AB
Protein, ur: 30 mg/dL — AB
Specific Gravity, Urine: 1.025 (ref 1.005–1.030)
Urobilinogen, UA: 0.2 mg/dL (ref 0.0–1.0)
pH: 6 (ref 5.0–8.0)

## 2014-08-12 LAB — CBC WITH DIFFERENTIAL/PLATELET
Basophils Absolute: 0 10*3/uL (ref 0.0–0.1)
Basophils Relative: 0 % (ref 0–1)
Eosinophils Absolute: 0 10*3/uL (ref 0.0–0.7)
Eosinophils Relative: 0 % (ref 0–5)
HCT: 29.7 % — ABNORMAL LOW (ref 36.0–46.0)
Hemoglobin: 8.6 g/dL — ABNORMAL LOW (ref 12.0–15.0)
Lymphocytes Relative: 6 % — ABNORMAL LOW (ref 12–46)
Lymphs Abs: 1 10*3/uL (ref 0.7–4.0)
MCH: 18.9 pg — ABNORMAL LOW (ref 26.0–34.0)
MCHC: 29 g/dL — ABNORMAL LOW (ref 30.0–36.0)
MCV: 65.3 fL — ABNORMAL LOW (ref 78.0–100.0)
Monocytes Absolute: 1.2 10*3/uL — ABNORMAL HIGH (ref 0.1–1.0)
Monocytes Relative: 7 % (ref 3–12)
Neutro Abs: 14.9 10*3/uL — ABNORMAL HIGH (ref 1.7–7.7)
Neutrophils Relative %: 87 % — ABNORMAL HIGH (ref 43–77)
Platelets: 522 10*3/uL — ABNORMAL HIGH (ref 150–400)
RBC: 4.55 MIL/uL (ref 3.87–5.11)
RDW: 18.6 % — ABNORMAL HIGH (ref 11.5–15.5)
WBC: 17.1 10*3/uL — ABNORMAL HIGH (ref 4.0–10.5)

## 2014-08-12 MED ORDER — ONDANSETRON HCL 4 MG/2ML IJ SOLN
4.0000 mg | Freq: Once | INTRAMUSCULAR | Status: AC
  Administered 2014-08-12: 4 mg via INTRAVENOUS
  Filled 2014-08-12: qty 2

## 2014-08-12 MED ORDER — MORPHINE SULFATE 4 MG/ML IJ SOLN
4.0000 mg | Freq: Once | INTRAMUSCULAR | Status: AC
  Administered 2014-08-12: 4 mg via INTRAVENOUS
  Filled 2014-08-12: qty 1

## 2014-08-12 MED ORDER — DEXTROSE 5 % IV SOLN
1.0000 g | Freq: Once | INTRAVENOUS | Status: AC
  Administered 2014-08-13: 1 g via INTRAVENOUS
  Filled 2014-08-12: qty 10

## 2014-08-12 MED ORDER — KETOROLAC TROMETHAMINE 15 MG/ML IJ SOLN
15.0000 mg | Freq: Once | INTRAMUSCULAR | Status: AC
  Administered 2014-08-12: 15 mg via INTRAVENOUS
  Filled 2014-08-12: qty 1

## 2014-08-12 MED ORDER — MORPHINE SULFATE 4 MG/ML IJ SOLN
4.0000 mg | Freq: Once | INTRAMUSCULAR | Status: AC
  Administered 2014-08-13: 4 mg via INTRAVENOUS
  Filled 2014-08-12: qty 1

## 2014-08-12 NOTE — ED Notes (Signed)
Patient presents from home via EMS for N/V/D, left and right flank pain x2 days. Rates pain 10/10. Patient A&O x4.

## 2014-08-12 NOTE — ED Provider Notes (Signed)
CSN: [id number]     Arrival date & time 08/12/14  2022 History   First MD Initiated Contact with Patient 08/12/14 2123     Chief Complaint  Patient presents with  . Abdominal Pain  . Nausea  . Vomiting  . Diarrhea     (Consider location/radiation/quality/duration/timing/severity/associated sxs/prior Treatment) HPI  33 year old female with abdominal pain. Symptom onset approximately 2 days ago. Abdominal pain is generalized. Perhaps somewhat worse her left flank and back.  In the past day developped nausea, vomiting and 2 episodes of diarrhea. Non bloody stool. No sick contacts.  Associated dysuria. No unusual vaginal bleeding or discharge. No fever. She has felt chills. Patient reports similar symptoms previously with pyelonephritis. Of note, admitted July and October of last year with pyelo.   Past Medical History  Diagnosis Date  . Ectopic pregnancy     2007   History reviewed. No pertinent past surgical history. No family history on file. History  Substance Use Topics  . Smoking status: Former Smoker    Quit date: 05/28/2013  . Smokeless tobacco: Never Used  . Alcohol Use: No   OB History   Grav Para Term Preterm Abortions TAB SAB Ect Mult Living                 Review of Systems  All systems reviewed and negative, other than as noted in HPI.   Allergies  Review of patient's allergies indicates no known allergies.  Home Medications   Prior to Admission medications   Not on File   BP 109/66  Pulse 94  Temp(Src) 98.2 F (36.8 C) (Oral)  Resp 16  SpO2 100%  LMP 08/05/2014 Physical Exam  Nursing note and vitals reviewed. Constitutional: She appears well-developed and well-nourished.  Appears uncomfortable  HENT:  Head: Normocephalic and atraumatic.  Eyes: Conjunctivae are normal. Right eye exhibits no discharge. Left eye exhibits no discharge.  Neck: Neck supple.  Cardiovascular: Normal rate, regular rhythm and normal heart sounds.  Exam reveals no  gallop and no friction rub.   No murmur heard. Pulmonary/Chest: Effort normal and breath sounds normal. No respiratory distress.  Abdominal: Soft. She exhibits no distension. There is tenderness. There is no rebound and no guarding.  Genitourinary:  cva tenderness  Musculoskeletal: She exhibits no edema and no tenderness.  Neurological: She is alert.  Skin: Skin is warm and dry. She is not diaphoretic.  Psychiatric: She has a normal mood and affect. Her behavior is normal. Thought content normal.    ED Course  Procedures (including critical care time) Labs Review Labs Reviewed  CBC WITH DIFFERENTIAL - Abnormal; Notable for the following:    WBC 17.1 (*)    Hemoglobin 8.6 (*)    HCT 29.7 (*)    MCV 65.3 (*)    MCH 18.9 (*)    MCHC 29.0 (*)    RDW 18.6 (*)    Platelets 522 (*)    Neutrophils Relative % 87 (*)    Lymphocytes Relative 6 (*)    Neutro Abs 14.9 (*)    Monocytes Absolute 1.2 (*)    All other components within normal limits  BASIC METABOLIC PANEL - Abnormal; Notable for the following:    Sodium 135 (*)    Potassium 3.4 (*)    All other components within normal limits  URINALYSIS, ROUTINE W REFLEX MICROSCOPIC - Abnormal; Notable for the following:    Color, Urine AMBER (*)    APPearance CLOUDY (*)    Hgb urine  dipstick MODERATE (*)    Bilirubin Urine SMALL (*)    Ketones, ur 15 (*)    Protein, ur 30 (*)    Nitrite POSITIVE (*)    Leukocytes, UA MODERATE (*)    All other components within normal limits  URINE MICROSCOPIC-ADD ON - Abnormal; Notable for the following:    Squamous Epithelial / LPF MANY (*)    Bacteria, UA MANY (*)    All other components within normal limits    Imaging Review No results found.   EKG Interpretation None      MDM   Final diagnoses:  Pyelonephritis    32yF with abdominal/flank/back pain. Clinically pyelonephritis. UA supports this. Afebrile. A lot of difficulty controlling symptoms. Will admit for further tx/symptom  management.     Raeford RazorStephen Kamiryn Bezanson, MD 08/14/14 [phone]

## 2014-08-12 NOTE — ED Notes (Signed)
Bed: WA06 Expected date:  Expected time:  Means of arrival:  Comments: EMS abd pain 

## 2014-08-12 NOTE — ED Notes (Signed)
VS 110/68, 88Hr, 18Resp, 103CBG

## 2014-08-12 NOTE — ED Notes (Signed)
Patient reports fever, chills, lightheadedness. Patient reports radiating pain to legs, urinary frequency.

## 2014-08-13 ENCOUNTER — Encounter (HOSPITAL_COMMUNITY): Payer: Self-pay | Admitting: Radiology

## 2014-08-13 ENCOUNTER — Inpatient Hospital Stay (HOSPITAL_COMMUNITY): Payer: MEDICAID

## 2014-08-13 DIAGNOSIS — N12 Tubulo-interstitial nephritis, not specified as acute or chronic: Principal | ICD-10-CM

## 2014-08-13 DIAGNOSIS — R52 Pain, unspecified: Secondary | ICD-10-CM

## 2014-08-13 LAB — CBC
HCT: 24.4 % — ABNORMAL LOW (ref 36.0–46.0)
Hemoglobin: 7.1 g/dL — ABNORMAL LOW (ref 12.0–15.0)
MCH: 19.1 pg — AB (ref 26.0–34.0)
MCHC: 29.1 g/dL — AB (ref 30.0–36.0)
MCV: 65.6 fL — AB (ref 78.0–100.0)
PLATELETS: 410 10*3/uL — AB (ref 150–400)
RBC: 3.72 MIL/uL — ABNORMAL LOW (ref 3.87–5.11)
RDW: 18.6 % — ABNORMAL HIGH (ref 11.5–15.5)
WBC: 11.7 10*3/uL — ABNORMAL HIGH (ref 4.0–10.5)

## 2014-08-13 LAB — COMPREHENSIVE METABOLIC PANEL
ALT: 6 U/L (ref 0–35)
AST: 13 U/L (ref 0–37)
Albumin: 3.5 g/dL (ref 3.5–5.2)
Alkaline Phosphatase: 58 U/L (ref 39–117)
Anion gap: 15 (ref 5–15)
BUN: 13 mg/dL (ref 6–23)
CHLORIDE: 104 meq/L (ref 96–112)
CO2: 21 meq/L (ref 19–32)
Calcium: 8.2 mg/dL — ABNORMAL LOW (ref 8.4–10.5)
Creatinine, Ser: 0.66 mg/dL (ref 0.50–1.10)
GFR calc Af Amer: >90 mL/min (ref >90)
Glucose, Bld: 80 mg/dL (ref 70–99)
Potassium: 3.6 mEq/L — ABNORMAL LOW (ref 3.7–5.3)
Sodium: 140 mEq/L (ref 137–147)
Total Bilirubin: 0.4 mg/dL (ref 0.3–1.2)
Total Protein: 6.8 g/dL (ref 6.0–8.3)

## 2014-08-13 LAB — HEMOGLOBIN A1C
Hgb A1c MFr Bld: 4.1 % (ref <5.7)
MEAN PLASMA GLUCOSE: 71 mg/dL (ref <117)

## 2014-08-13 LAB — TSH: TSH: 1.98 u[IU]/mL (ref 0.350–4.500)

## 2014-08-13 LAB — GLUCOSE, CAPILLARY: GLUCOSE-CAPILLARY: 85 mg/dL (ref 70–99)

## 2014-08-13 LAB — PREGNANCY, URINE: Preg Test, Ur: NEGATIVE

## 2014-08-13 MED ORDER — LORAZEPAM 2 MG/ML IJ SOLN: 0.5000 mg | INTRAMUSCULAR | Status: DC | PRN

## 2014-08-13 MED ORDER — HYDROCODONE-ACETAMINOPHEN 5-325 MG PO TABS
1.0000 | ORAL_TABLET | Freq: Four times a day (QID) | ORAL | Status: DC | PRN
  Administered 2014-08-13 – 2014-08-14 (×2): 1 via ORAL
  Filled 2014-08-13 (×3): qty 1

## 2014-08-13 MED ORDER — ONDANSETRON HCL 4 MG/2ML IJ SOLN
4.0000 mg | Freq: Four times a day (QID) | INTRAMUSCULAR | Status: DC | PRN
  Administered 2014-08-13: 4 mg via INTRAVENOUS
  Filled 2014-08-13: qty 2

## 2014-08-13 MED ORDER — IOHEXOL 300 MG/ML  SOLN: 25.0000 mL | Freq: Once | INTRAMUSCULAR | Status: AC | PRN

## 2014-08-13 MED ORDER — KCL IN DEXTROSE-NACL 40-5-0.45 MEQ/L-%-% IV SOLN
INTRAVENOUS | Status: DC
  Administered 2014-08-13: 13:00:00 via INTRAVENOUS
  Filled 2014-08-13 (×2): qty 1000

## 2014-08-13 MED ORDER — IOHEXOL 300 MG/ML  SOLN
25.0000 mL | INTRAMUSCULAR | Status: AC
  Administered 2014-08-13 (×2): 25 mL via ORAL

## 2014-08-13 MED ORDER — MORPHINE SULFATE 2 MG/ML IJ SOLN
2.0000 mg | INTRAMUSCULAR | Status: DC | PRN
  Administered 2014-08-13 (×2): 2 mg via INTRAVENOUS
  Filled 2014-08-13 (×2): qty 1

## 2014-08-13 MED ORDER — POLYETHYLENE GLYCOL 3350 17 G PO PACK
17.0000 g | PACK | Freq: Every day | ORAL | Status: DC | PRN
  Filled 2014-08-13: qty 1

## 2014-08-13 MED ORDER — ACETAMINOPHEN 650 MG RE SUPP
650.0000 mg | Freq: Four times a day (QID) | RECTAL | Status: DC | PRN
  Filled 2014-08-13: qty 1

## 2014-08-13 MED ORDER — ADULT MULTIVITAMIN W/MINERALS CH
1.0000 | ORAL_TABLET | Freq: Every day | ORAL | Status: DC
  Administered 2014-08-14 – 2014-08-15 (×2): 1 via ORAL
  Filled 2014-08-13 (×3): qty 1

## 2014-08-13 MED ORDER — SODIUM CHLORIDE 0.9 % IV SOLN
INTRAVENOUS | Status: DC
  Administered 2014-08-13: 02:00:00 via INTRAVENOUS

## 2014-08-13 MED ORDER — INFLUENZA VAC SPLIT QUAD 0.5 ML IM SUSY
0.5000 mL | PREFILLED_SYRINGE | INTRAMUSCULAR | Status: DC
  Filled 2014-08-13 (×3): qty 0.5

## 2014-08-13 MED ORDER — IOHEXOL 300 MG/ML  SOLN
100.0000 mL | Freq: Once | INTRAMUSCULAR | Status: AC | PRN
  Administered 2014-08-13: 100 mL via INTRAVENOUS

## 2014-08-13 MED ORDER — FOLIC ACID 1 MG PO TABS
1.0000 mg | ORAL_TABLET | Freq: Every day | ORAL | Status: DC
  Administered 2014-08-14 – 2014-08-15 (×2): 1 mg via ORAL
  Filled 2014-08-13 (×3): qty 1

## 2014-08-13 MED ORDER — VITAMIN B-1 100 MG PO TABS
100.0000 mg | ORAL_TABLET | Freq: Every day | ORAL | Status: DC
  Administered 2014-08-14 – 2014-08-15 (×2): 100 mg via ORAL
  Filled 2014-08-13 (×3): qty 1

## 2014-08-13 MED ORDER — ONDANSETRON HCL 4 MG PO TABS
4.0000 mg | ORAL_TABLET | Freq: Four times a day (QID) | ORAL | Status: DC | PRN
  Administered 2014-08-13: 4 mg via ORAL
  Filled 2014-08-13: qty 1

## 2014-08-13 MED ORDER — PNEUMOCOCCAL VAC POLYVALENT 25 MCG/0.5ML IJ INJ
0.5000 mL | INJECTION | INTRAMUSCULAR | Status: DC
  Filled 2014-08-13 (×3): qty 0.5

## 2014-08-13 MED ORDER — ACETAMINOPHEN 325 MG PO TABS: 650.0000 mg | ORAL_TABLET | Freq: Four times a day (QID) | ORAL | Status: DC | PRN

## 2014-08-13 MED ORDER — DEXTROSE 5 % IV SOLN
1.0000 g | INTRAVENOUS | Status: DC
  Administered 2014-08-13: 1 g via INTRAVENOUS
  Administered 2014-08-14: 22:00:00 via INTRAVENOUS
  Filled 2014-08-13 (×3): qty 10

## 2014-08-13 MED ORDER — MORPHINE SULFATE 2 MG/ML IJ SOLN
2.0000 mg | INTRAMUSCULAR | Status: DC | PRN
  Administered 2014-08-13 – 2014-08-15 (×5): 2 mg via INTRAVENOUS
  Filled 2014-08-13 (×5): qty 1

## 2014-08-13 MED ORDER — HEPARIN SODIUM (PORCINE) 5000 UNIT/ML IJ SOLN
5000.0000 [IU] | Freq: Three times a day (TID) | INTRAMUSCULAR | Status: DC
  Administered 2014-08-13 (×2): 5000 [IU] via SUBCUTANEOUS
  Filled 2014-08-13 (×7): qty 1

## 2014-08-13 NOTE — Progress Notes (Signed)
ANTIBIOTIC CONSULT NOTE - INITIAL  Pharmacy Consult for Rocephin Indication: Pyelonephritis  No Known Allergies  Patient Measurements:   Wt=49 kg  Vital Signs: Temp: 98.2 F (36.8 C) (10/09 0007) Temp Source: Oral (10/09 0007) BP: 103/57 mmHg (10/09 0114) Pulse Rate: 62 (10/09 0114) Intake/Output from previous day:   Intake/Output from this shift:    Labs:  Recent Labs  08/12/14 2158  WBC 17.1*  HGB 8.6*  PLT 522*  CREATININE 0.68   The CrCl is unknown because both a height and weight (above a minimum accepted value) are required for this calculation. No results found for this basename: VANCOTROUGH, VANCOPEAK, VANCORANDOM, GENTTROUGH, GENTPEAK, GENTRANDOM, TOBRATROUGH, TOBRAPEAK, TOBRARND, AMIKACINPEAK, AMIKACINTROU, AMIKACIN,  in the last 72 hours   Microbiology: No results found for this or any previous visit (from the past 720 hour(s)).  Medical History: Past Medical History  Diagnosis Date  . Ectopic pregnancy     2007    Medications:  Scheduled:  . cefTRIAXone (ROCEPHIN)  IV  1 g Intravenous Q24H  . folic acid  1 mg Oral Daily  . heparin  5,000 Units Subcutaneous 3 times per day  . multivitamin with minerals  1 tablet Oral Daily  . thiamine  100 mg Oral Daily   Infusions:  . sodium chloride     Assessment: 29 yoF presents with recurring pyelonephritis.  Rocephin per Rx.  Goal of Therapy:  Treat infection  Plan:   Rocephin 1gm IV q24h  F/U cultures as needed  Susanne Greenhouse R 08/13/2014,1:44 AM

## 2014-08-13 NOTE — Care Management Note (Signed)
    Page 1 of 1   08/13/2014     3:56:34 PM CARE MANAGEMENT NOTE 08/13/2014  Patient:  Marcia Richardson,Marcia Richardson   Account Number:  [id number]  Date Initiated:  08/13/2014  Documentation initiated by:  Lorenda IshiharaPEELE,Shamikia Linskey  Subjective/Objective Assessment:   33 yo female admitted with pyelonephritis. PTA lived at home.     Action/Plan:   Home when stable, may need medication assistance, needs to call for appt with [facility].   Anticipated DC Date:  08/16/2014   Anticipated DC Plan:  HOME/SELF CARE      DC Planning Services  CM consult  Medication Assistance  PCP issues      Choice offered to / List presented to:             Status of service:  Completed, signed off Medicare Important Message given?   (If response is "NO", the following Medicare IM given date fields will be blank) Date Medicare IM given:   Medicare IM given by:   Date Additional Medicare IM given:   Additional Medicare IM given by:    Discharge Disposition:  HOME/SELF CARE  Per UR Regulation:  Reviewed for med. necessity/level of care/duration of stay  If discussed at Long Length of Stay Meetings, dates discussed:    Comments:  08-13-14 Lorenda IshiharaSuzanne Sabrina Keough RN CM 1530 Spoke with patient at bedside. [facility] but never [facility] so. She is familiar with the clinic and what it offers. Tried to get appt scheculed but schedule was full, patient instructed to call back on Monday arround 9am to schedule appt. Discussed medication needs, currently does not take any medications, states she has a discount card that she has use in the past for medication. She can afford meds if not too expensive. Will continue to follow for d/c needs and meds.

## 2014-08-13 NOTE — Progress Notes (Signed)
No loose stool for 24 hrs.Marcia Richardson, Marcia Richardson

## 2014-08-13 NOTE — Progress Notes (Addendum)
[facility] PROGRESS NOTE  Marcia Richardson SLP:[id number] DOB: 06-20-1981 DOA: 08/12/2014 PCP: No PCP Per Patient  Assessment/Plan  Pyelonephritis, improving with decreasing temperature and WBC - continue rocephin daily  - continue IVF and antiemetics -  F/u urine culture -  Start transition to oral pain medication  Patient extremely anxious about having kidney cancer.  Cousin has kidney cancer.  CT scan from 1 year ago negative, but she has been having unexplained abdominal pain intermittently with iron-deficiency anemia  -  CT scan with IV contrast  Leukocytosis, resolving with antibiotics  Microcytic anemia, previously had iron deficiency anemia and had normal pelvic US from a year ago -  Iron studies, b12, folate, TSH, occult stool -  Hemoglobin electrophoresis -  Repeat CBC in AM   Hypokalemia due to diarrhea and vomiting -  Supplement in IVF  Diet:  regular Access:  PIV IVF:  yes Proph:  heparin  Code Status: full Family Communication: patient and her sister on phone Disposition Plan: pending pain controlled, infection resolving, further eval for anemia >> may need blood transfusion tomorrow   Consultants:  none  Procedures:  CT abd/pelvis  Antibiotics:  Ceftriaxone 10/9   HPI/Subjective:  Extremely anxious.  States no one has been telling her what is going one although the RN reviewed her condition with her and she was already aware of her primary diagnosis from the ER staff and admitting MD.  Difficult historian but nausea and diarrhea are somewhat better, although abdominal pain has persisted and she continues to have chills.   Objective: Filed Vitals:   08/13/14 0100 08/13/14 0114 08/13/14 0147 08/13/14 0625  BP: 101/57 103/57 80/52 101/57  Pulse: 59 62 93 64  Temp:   98.1 F (36.7 C) 97.9 F (36.6 C)  TempSrc:   Oral Oral  Resp: 13 14 16 16   Height:   5' (1.524 m)   Weight:   49.442 kg (109 lb)   SpO2: 98% 98% 100% 100%     Intake/Output Summary (Last 24 hours) at 08/13/14 1220 Last data filed at 08/13/14 0458  Gross per 24 hour  Intake 226.25 ml  Output      0 ml  Net 226.25 ml   Filed Weights   08/13/14 0147  Weight: 49.442 kg (109 lb)    Exam:   General:  Thin BF, No acute distress  HEENT:  NCAT, MMM  Cardiovascular:  RRR, nl S1, S2 no mrg, 2+ pulses, warm extremities  Respiratory:  CTAB, no increased WOB  Abdomen:   NABS, soft, ND, TTP in LUQ and suprapubic area without rebound or guarding, + left flank pain  MSK:   Normal tone and bulk, no LEE  Neuro:  Grossly intact  Psych:  Anxious, tearful, perseverates on possibility of cancer.    Data Reviewed: Basic Metabolic Panel:  Recent Labs Lab 08/12/14 2158 08/13/14 0538  NA 135* 140  K 3.4* 3.6*  CL 100 104  CO2 21 21  GLUCOSE 79 80  BUN 13 13  CREATININE 0.68 0.66  CALCIUM 9.7 8.2*   Liver Function Tests:  Recent Labs Lab 08/13/14 0538  AST 13  ALT 6  ALKPHOS 58  BILITOT 0.4  PROT 6.8  ALBUMIN 3.5   No results found for this basename: LIPASE, AMYLASE,  in the last 168 hours No results found for this basename: AMMONIA,  in the last 168 hours CBC:  Recent Labs Lab 08/12/14 2158 08/13/14 0538  WBC 17.1* 11.7*  NEUTROABS 14.9*  --  HGB 8.6* 7.1*  HCT 29.7* 24.4*  MCV 65.3* 65.6*  PLT 522* 410*   Cardiac Enzymes: No results found for this basename: CKTOTAL, CKMB, CKMBINDEX, TROPONINI,  in the last 168 hours BNP (last 3 results) No results found for this basename: PROBNP,  in the last 8760 hours CBG:  Recent Labs Lab 08/13/14 0741  GLUCAP 85    No results found for this or any previous visit (from the past 240 hour(s)).   Studies: No results found.  Scheduled Meds: . cefTRIAXone (ROCEPHIN)  IV  1 g Intravenous Q24H  . folic acid  1 mg Oral Daily  . heparin  5,000 Units Subcutaneous 3 times per day  . [START ON 08/14/2014] Influenza vac split quadrivalent PF  0.5 mL Intramuscular  Tomorrow-1000  . multivitamin with minerals  1 tablet Oral Daily  . [START ON 08/14/2014] pneumococcal 23 valent vaccine  0.5 mL Intramuscular Tomorrow-1000  . thiamine  100 mg Oral Daily   Continuous Infusions: . sodium chloride 75 mL/hr at 08/13/14 0157    Principal Problem:   Pyelonephritis Active Problems:   Inadequate pain control    Time spent: 30 min    Marcia Richardson, Marcia Richardson  [facility] Pager [phone]. If 7PM-7AM, please contact night-coverage at [email or web address], password [email or web address] 08/13/2014, 12:20 PM  LOS: 1 day

## 2014-08-13 NOTE — H&P (Signed)
[facility] History and Physical  Marcia Richardson WJX:[id number] DOB: 02/18/1981 DOA: 08/12/2014  Referring physician: Baldemar FridaySteven Kohut, MD PCP: No PCP Per Patient   Chief Complaint: Pyelonephritis  HPI: Marcia Richardson is a 33 y.o. female presents with recurring pyelonephritis. Patient states that she has been having pain in her abdomen and back. She states there has been some dysuria noted also. Patient states she has had diarrhea and nausea. She has been sweating also. She has had similar complaints about a year ago. In the ED she was given pain control but she is not improving and will require ongoing pain management. She has already been cultured and given a gram of rocephin in the ED. She states there is no blood in her urine she states that has not had any other complaints.   Review of Systems:  Constitutional:  No weight loss, ++night sweats, ++Fevers, ++chills, ++fatigue.  HEENT:  No headaches, Difficulty swallowing, post nasal drip,  Cardio-vascular:  No chest pain, Orthopnea, PND, swelling in lower extremities  GI:  No heartburn, indigestion, abdominal pain, ++nausea, no vomiting, ++diarrhea  Resp:  No shortness of breath with exertion or at rest. No excess mucus, no productive cough, No non-productive cough, No coughing up of blood  Skin:  no rash or lesions.  GU:  ++dysuria, no change in color of urine ++flank pain.  Musculoskeletal:  No joint pain or swelling. No decreased range of motion. ++back pain.  Psych:  No change in mood or affect. No depression or anxiety   Past Medical History  Diagnosis Date  . Ectopic pregnancy     2007   History reviewed. No pertinent past surgical history. Social History:  reports that she quit smoking about 14 months ago. She has never used smokeless tobacco. She reports that she does not drink alcohol or use illicit drugs.  No Known Allergies  No family history on file.   Prior to Admission medications   Not on File    Physical Exam: Filed Vitals:   08/12/14 2027 08/13/14 0007  BP: 109/66 123/83  Pulse: 94 62  Temp: 98.2 F (36.8 C) 98.2 F (36.8 C)  TempSrc: Oral Oral  Resp: 16 16  SpO2: 100% 99%    Wt Readings from Last 3 Encounters:  08/11/13 49.8 kg (109 lb 12.6 oz)  06/03/13 51.2 kg (112 lb 14 oz)    General:  Appears calm and comfortable Eyes: PERRL, normal lids, irises & conjunctiva ENT: grossly normal hearing, lips & tongue Neck: no LAD, masses or thyromegaly Cardiovascular: RRR, no m/r/g. No LE edema. Respiratory: CTA bilaterally, no w/r/r. Normal respiratory effort. Abdomen: c/o diffuse tenderness on palpation no rebound Skin: no rash or induration seen on limited exam Musculoskeletal: grossly normal tone BUE/BLE Psychiatric: grossly normal mood and affect, speech fluent and appropriate Neurologic: grossly non-focal.          Labs on Admission:  Basic Metabolic Panel:  Recent Labs Lab 08/12/14 2158  NA 135*  K 3.4*  CL 100  CO2 21  GLUCOSE 79  BUN 13  CREATININE 0.68  CALCIUM 9.7   Liver Function Tests: No results found for this basename: AST, ALT, ALKPHOS, BILITOT, PROT, ALBUMIN,  in the last 168 hours No results found for this basename: LIPASE, AMYLASE,  in the last 168 hours No results found for this basename: AMMONIA,  in the last 168 hours CBC:  Recent Labs Lab 08/12/14 2158  WBC 17.1*  NEUTROABS 14.9*  HGB 8.6*  HCT 29.7*  MCV 65.3*  PLT 522*   Cardiac Enzymes: No results found for this basename: CKTOTAL, CKMB, CKMBINDEX, TROPONINI,  in the last 168 hours  BNP (last 3 results) No results found for this basename: PROBNP,  in the last 8760 hours CBG: No results found for this basename: GLUCAP,  in the last 168 hours  Radiological Exams on Admission: No results found.   Assessment/Plan Principal Problem:   Pyelonephritis Active Problems:   Inadequate pain control   1. Pyelonephritis -she has had recurring bouts of the same last  year -will start on rocephin daily -start on IVF for hydration -urine cultures -may consider urology consultation  2. Pain Control -started on morphine in ED -she is still complaining of severe pain -will monitor   Code Status: Full Code (must indicate code status--if unknown or must be presumed, indicate so) DVT Prophylaxis:Heparin Family Communication: None (indicate person spoken with, if applicable, with phone number if by telephone) Disposition Plan: Home (indicate anticipated LOS)  Time spent:  [facility] Pager [phone]

## 2014-08-14 DIAGNOSIS — R1031 Right lower quadrant pain: Secondary | ICD-10-CM

## 2014-08-14 LAB — BASIC METABOLIC PANEL
ANION GAP: 10 (ref 5–15)
BUN: 4 mg/dL — AB (ref 6–23)
CHLORIDE: 106 meq/L (ref 96–112)
CO2: 23 meq/L (ref 19–32)
Calcium: 8.5 mg/dL (ref 8.4–10.5)
Creatinine, Ser: 0.62 mg/dL (ref 0.50–1.10)
GFR calc Af Amer: >90 mL/min (ref >90)
GFR calc non Af Amer: >90 mL/min (ref >90)
Glucose, Bld: 88 mg/dL (ref 70–99)
Potassium: 4.2 mEq/L (ref 3.7–5.3)
Sodium: 139 mEq/L (ref 137–147)

## 2014-08-14 LAB — PREPARE RBC (CROSSMATCH)

## 2014-08-14 LAB — URINALYSIS, ROUTINE W REFLEX MICROSCOPIC
BILIRUBIN URINE: NEGATIVE
GLUCOSE, UA: NEGATIVE mg/dL
HGB URINE DIPSTICK: NEGATIVE
Ketones, ur: NEGATIVE mg/dL
Nitrite: NEGATIVE
PROTEIN: NEGATIVE mg/dL
Specific Gravity, Urine: 1.01 (ref 1.005–1.030)
Urobilinogen, UA: 1 mg/dL (ref 0.0–1.0)
pH: 6.5 (ref 5.0–8.0)

## 2014-08-14 LAB — URINE CULTURE: Colony Count: >=100000

## 2014-08-14 LAB — CBC
HEMATOCRIT: 21.7 % — AB (ref 36.0–46.0)
Hemoglobin: 6.3 g/dL — CL (ref 12.0–15.0)
MCH: 19.4 pg — ABNORMAL LOW (ref 26.0–34.0)
MCHC: 29 g/dL — AB (ref 30.0–36.0)
MCV: 67 fL — ABNORMAL LOW (ref 78.0–100.0)
Platelets: 331 10*3/uL (ref 150–400)
RBC: 3.24 MIL/uL — ABNORMAL LOW (ref 3.87–5.11)
RDW: 18.6 % — ABNORMAL HIGH (ref 11.5–15.5)
WBC: 5.8 10*3/uL (ref 4.0–10.5)

## 2014-08-14 LAB — SAMPLE TO BLOOD BANK

## 2014-08-14 LAB — IRON AND TIBC
Iron: <10 ug/dL — ABNORMAL LOW (ref 42–135)
UIBC: 366 ug/dL (ref 125–400)

## 2014-08-14 LAB — URINE MICROSCOPIC-ADD ON

## 2014-08-14 LAB — FERRITIN: Ferritin: 7 ng/mL — ABNORMAL LOW (ref 10–291)

## 2014-08-14 LAB — GLUCOSE, CAPILLARY: Glucose-Capillary: 84 mg/dL (ref 70–99)

## 2014-08-14 LAB — FOLATE RBC: RBC Folate: 678 ng/mL — ABNORMAL HIGH (ref >280)

## 2014-08-14 LAB — VITAMIN B12: Vitamin B-12: 392 pg/mL (ref 211–911)

## 2014-08-14 MED ORDER — METRONIDAZOLE 500 MG PO TABS
2000.0000 mg | ORAL_TABLET | Freq: Once | ORAL | Status: DC
  Filled 2014-08-14: qty 4

## 2014-08-14 MED ORDER — GUAIFENESIN 100 MG/5ML PO SYRP
200.0000 mg | ORAL_SOLUTION | ORAL | Status: DC | PRN
  Administered 2014-08-14: 200 mg via ORAL
  Filled 2014-08-14: qty 10

## 2014-08-14 MED ORDER — SODIUM CHLORIDE 0.9 % IV SOLN: Freq: Once | INTRAVENOUS | Status: DC

## 2014-08-14 MED ORDER — MEGESTROL ACETATE 20 MG PO TABS
20.0000 mg | ORAL_TABLET | Freq: Two times a day (BID) | ORAL | Status: DC
  Administered 2014-08-14 – 2014-08-15 (×2): 20 mg via ORAL
  Filled 2014-08-14 (×4): qty 1

## 2014-08-14 NOTE — Progress Notes (Signed)
Writer told in a.m. Report today that lab was unable to get blood for morning labs. Will call lab to get another assessment.

## 2014-08-14 NOTE — Progress Notes (Signed)
ANTIBIOTIC CONSULT NOTE - FOLLOW UP  Pharmacy Consult for ceftriaxone Indication: pyelonephritis  No Known Allergies  Patient Measurements: Height: 5' (152.4 cm) Weight: 109 lb (49.442 kg) IBW/kg (Calculated) : 45.5  Assessment: 32 yoF presents 10/9 with recurring pyelonephritis. Patient states that she has been having pain in her abdomen and back. She states there has been some dysuria noted also. Patient states she has had diarrhea and nausea.  Pharmacy is consulted to dose ceftriaxone for pyelonephritis.   Microbiology 10/8 >> ceftriaxone >>  Labs / vitals Tmax: remains afebrile WBCs: now WNL Renal: SCr 0.62- stable, CrCl 71 ml/min CG, >100 ml/min normalized  Microbiology 10/9 urine: >100k multiple bacterial morphotypes, suggest recollect (many squams on UA) 10/10 urine: IP  Goal of Therapy:  ceftriaxone per indication and weight  Plan:  - continue ceftriaxone 1g IV q24h - Pharmacy will sign-off as adjustment of ceftriaxone dose is not necessary. Please re-consult if needed.  Thank you for the consult.  Ross Ludwig, PharmD, BCPS Pager: [phone] Pharmacy: [phone] 08/14/2014 2:20 PM

## 2014-08-14 NOTE — Progress Notes (Addendum)
[facility] PROGRESS NOTE  Marcia Richardson Height [id number] DOB: 04/27/1981 DOA: 08/12/2014 PCP: No PCP Per Patient  Assessment/Plan  Sepsis due to pyelonephritis, WBC down to normal - continue rocephin daily day 2 - continue IVF and antiemetics -  urine culture with mixed flora -  Repeat UA and UCx -  Start transition to oral pain medication  Patient extremely anxious about having kidney cancer.  Cousin has kidney cancer.  CT scan from 1 year ago negative, but she has been having unexplained abdominal pain intermittently with iron-deficiency anemia  -  CT scan with IV contrast:  Thickening of distal bowel and moderate free fluid in abdomen.  Wonder if she has some underlying crohn's disease -  Recommend her primary care doctor refer her to a gastroenterologist after she has completed her course of antibiotics  Menorrhagia, patient states she has stopped smoking. She was advised of the risk of life-threatening blood clots should she smoke while taking these medications. -  Start Megace -  Defer initiation of oral contraceptive to her primary care doctor  Leukocytosis, resolving with antibiotics  Microcytic anemia, previously had iron deficiency anemia and had normal pelvic US from a year ago, hgb 6.3 mg/dl (similar to last year).  Pelvic ultrasound from last year was within normal limits. -  Iron studies, b12, folate, TSH, occult stool pending -  Hemoglobin electrophoresis pending -  Check von willebrand -  Transfuse 2 units PRBC  Hypokalemia due to diarrhea and vomiting, resolved with IVF  Diet:  Full liquid Access:  PIV IVF:  yes Proph:  SCDs  Code Status: full Family Communication: patient and her sister on phone Disposition Plan:  Once tolerating a diet, pain controlled.  Getting blood transfusion today  Consultants:  none  Procedures:  10/9 CT abd/pelvis:  Bilateral pyelonephritis, moderate free fluid in the abdomen, distal small bowel wall  thickening  Antibiotics:  Ceftriaxone 10/9   HPI/Subjective:  Continues to have nausea and makes it difficult to eat. States her back pain is better but her abdominal pain persists.    Objective: Filed Vitals:   08/13/14 0625 08/13/14 1400 08/13/14 2106 08/14/14 0604  BP: 101/57 112/71 94/54 100/60  Pulse: 64 63 67 63  Temp: 97.9 F (36.6 C) 98.2 F (36.8 C) 98.3 F (36.8 C) 97.9 F (36.6 C)  TempSrc: Oral Oral Oral Oral  Resp: 16 18 20 15   Height:      Weight:      SpO2: 100% 100% 100% 100%    Intake/Output Summary (Last 24 hours) at 08/14/14 0845 Last data filed at 08/13/14 1400  Gross per 24 hour  Intake    530 ml  Output      0 ml  Net    530 ml   Filed Weights   08/13/14 0147  Weight: 49.442 kg (109 lb)    Exam:   General:  Thin BF, No acute distress  HEENT:  NCAT, MMM  Cardiovascular:  RRR, nl S1, S2 no mrg, 2+ pulses, warm extremities  Respiratory:  CTAB, no increased WOB  Abdomen:   NABS, soft, ND, TTP in RLQ and suprapubic area without rebound or guarding, + left flank pain  MSK:   Normal tone and bulk, no LEE  Neuro:  Grossly intact   Data Reviewed: Basic Metabolic Panel:  Recent Labs Lab 08/12/14 2158 08/13/14 0538 08/14/14 0751  NA 135* 140 139  K 3.4* 3.6* 4.2  CL 100 104 106  CO2 21 21 23   GLUCOSE  79 80 88  BUN 13 13 4*  CREATININE 0.68 0.66 0.62  CALCIUM 9.7 8.2* 8.5   Liver Function Tests:  Recent Labs Lab 08/13/14 0538  AST 13  ALT 6  ALKPHOS 58  BILITOT 0.4  PROT 6.8  ALBUMIN 3.5   No results found for this basename: LIPASE, AMYLASE,  in the last 168 hours No results found for this basename: AMMONIA,  in the last 168 hours CBC:  Recent Labs Lab 08/12/14 2158 08/13/14 0538 08/14/14 0751  WBC 17.1* 11.7* 5.8  NEUTROABS 14.9*  --   --   HGB 8.6* 7.1* 6.3*  HCT 29.7* 24.4* 21.7*  MCV 65.3* 65.6* 67.0*  PLT 522* 410* 331   Cardiac Enzymes: No results found for this basename: CKTOTAL, CKMB, CKMBINDEX,  TROPONINI,  in the last 168 hours BNP (last 3 results) No results found for this basename: PROBNP,  in the last 8760 hours CBG:  Recent Labs Lab 08/13/14 0741 08/14/14 0812  GLUCAP 85 84    Recent Results (from the past 240 hour(s))  URINE CULTURE     Status: None   Collection Time    08/13/14 12:06 AM      Result Value Ref Range Status   Specimen Description URINE, CLEAN CATCH   Final   Special Requests NONE   Final   Culture  Setup Time     Final   Value: 08/13/2014 04:16     Performed at [facility] Count     Final   Value: >=100,000 COLONIES/ML     Performed at [facility]   Culture     Final   Value: Multiple bacterial morphotypes present, none predominant. Suggest appropriate recollection if clinically indicated.     Performed at [facility]   Report Status 08/14/2014 FINAL   Final     Studies: Ct Abdomen Pelvis W Contrast  08/13/2014   CLINICAL DATA:  Recurrent pyelonephritis.  Diffuse abdominal pain.  EXAM: CT ABDOMEN AND PELVIS WITH CONTRAST  TECHNIQUE: Multidetector CT imaging of the abdomen and pelvis was performed using the standard protocol following bolus administration of intravenous contrast.  CONTRAST:  OMNIPAQUE IOHEXOL 300 MG/ML  SOLN  COMPARISON:  CT 08/11/2013  FINDINGS: Visualization of the lower thorax demonstrates no consolidative or nodular pulmonary opacities. Normal heart size.  Liver is normal in size and contour. Probable 5 mm flash filling hemangioma within the left hepatic lobe. Fatty deposition adjacent to the falciform ligament. Spleen is unremarkable. Pancreas is unremarkable. Normal adrenal glands.  Multiple patchy areas of hypoattenuation are demonstrated within the right and left kidneys. This is more focally evident within the interpolar region of the right kidney and superior/inferior poles of the left kidney.  Normal caliber abdominal aorta. No retroperitoneal lymphadenopathy. Urinary bladder is  unremarkable. Small to moderate amount of free fluid in the pelvis. Adnexal structures are unremarkable.  No evidence for bowel obstruction. No free intraperitoneal air. Suggestion of mild thickening of the distal small bowel with hyperenhancement.  Probable injection granulomata within the subcutaneous fat of the anterior abdominal wall. Normal appendix.  No aggressive or acute appearing osseous lesions.  IMPRESSION: 1. Heterogeneous enhancement of the bilateral kidneys suggestive of pyelonephritis. 2. Suggestion of mild thickening of the distal small bowel and associated hyper enhancement. While the enhancement may be secondary to the phase of contrast, recommend correlation as the findings may be secondary to enteritis. 3. Small to moderate amount of free fluid in the  pelvis, more than expected for physiologic fluid.   Electronically Signed   By: Annia Belt M.D.   On: 08/13/2014 15:18    Scheduled Meds: . sodium chloride   Intravenous Once  . cefTRIAXone (ROCEPHIN)  IV  1 g Intravenous Q24H  . folic acid  1 mg Oral Daily  . heparin  5,000 Units Subcutaneous 3 times per day  . Influenza vac split quadrivalent PF  0.5 mL Intramuscular Tomorrow-1000  . multivitamin with minerals  1 tablet Oral Daily  . pneumococcal 23 valent vaccine  0.5 mL Intramuscular Tomorrow-1000  . thiamine  100 mg Oral Daily   Continuous Infusions: . dextrose 5 % and 0.45 % NaCl with KCl 40 mEq/L 75 mL/hr at 08/13/14 1315    Principal Problem:   Pyelonephritis Active Problems:   Inadequate pain control    Time spent: 30 min    Cerise Lieber, [facility]  [facility] Pager [phone]. If 7PM-7AM, please contact night-coverage at [email or web address], password [facility] 08/14/2014, 8:45 AM  LOS: 2 days

## 2014-08-15 DIAGNOSIS — N920 Excessive and frequent menstruation with regular cycle: Secondary | ICD-10-CM

## 2014-08-15 DIAGNOSIS — D509 Iron deficiency anemia, unspecified: Secondary | ICD-10-CM

## 2014-08-15 DIAGNOSIS — A599 Trichomoniasis, unspecified: Secondary | ICD-10-CM

## 2014-08-15 LAB — CBC
HCT: 31.4 % — ABNORMAL LOW (ref 36.0–46.0)
Hemoglobin: 10 g/dL — ABNORMAL LOW (ref 12.0–15.0)
MCH: 22.5 pg — ABNORMAL LOW (ref 26.0–34.0)
MCHC: 31.8 g/dL (ref 30.0–36.0)
MCV: 70.7 fL — AB (ref 78.0–100.0)
Platelets: 321 10*3/uL (ref 150–400)
RBC: 4.44 MIL/uL (ref 3.87–5.11)
RDW: 20.4 % — ABNORMAL HIGH (ref 11.5–15.5)
WBC: 7.1 10*3/uL (ref 4.0–10.5)

## 2014-08-15 LAB — BASIC METABOLIC PANEL
Anion gap: 11 (ref 5–15)
BUN: 5 mg/dL — ABNORMAL LOW (ref 6–23)
CO2: 23 mEq/L (ref 19–32)
Calcium: 8.8 mg/dL (ref 8.4–10.5)
Chloride: 104 mEq/L (ref 96–112)
Creatinine, Ser: 0.69 mg/dL (ref 0.50–1.10)
GFR calc Af Amer: >90 mL/min (ref >90)
GFR calc non Af Amer: >90 mL/min (ref >90)
GLUCOSE: 78 mg/dL (ref 70–99)
POTASSIUM: 3.8 meq/L (ref 3.7–5.3)
Sodium: 138 mEq/L (ref 137–147)

## 2014-08-15 LAB — URINE CULTURE
CULTURE: NO GROWTH
Colony Count: NO GROWTH

## 2014-08-15 LAB — TYPE AND SCREEN
ABO/RH(D): O NEG
ANTIBODY SCREEN: NEGATIVE
UNIT DIVISION: 0
Unit division: 0

## 2014-08-15 MED ORDER — DOCUSATE SODIUM 100 MG PO CAPS: 100.0000 mg | ORAL_CAPSULE | Freq: Two times a day (BID) | ORAL | Status: AC

## 2014-08-15 MED ORDER — CIPROFLOXACIN HCL 500 MG PO TABS: 500.0000 mg | ORAL_TABLET | Freq: Two times a day (BID) | ORAL | Status: AC

## 2014-08-15 MED ORDER — MEGESTROL ACETATE 20 MG PO TABS: 20.0000 mg | ORAL_TABLET | Freq: Two times a day (BID) | ORAL | Status: AC

## 2014-08-15 MED ORDER — ONDANSETRON HCL 4 MG PO TABS: 4.0000 mg | ORAL_TABLET | Freq: Three times a day (TID) | ORAL | Status: DC | PRN

## 2014-08-15 MED ORDER — METRONIDAZOLE 50 MG/ML ORAL SUSPENSION
2000.0000 mg | Freq: Once | ORAL | Status: AC
  Administered 2014-08-15: 2000 mg via ORAL
  Filled 2014-08-15: qty 40

## 2014-08-15 MED ORDER — HYDROCODONE-ACETAMINOPHEN 5-325 MG PO TABS: 1.0000 | ORAL_TABLET | Freq: Four times a day (QID) | ORAL | Status: AC | PRN

## 2014-08-15 MED ORDER — FERROUS SULFATE 325 (65 FE) MG PO TBEC: 325.0000 mg | DELAYED_RELEASE_TABLET | Freq: Three times a day (TID) | ORAL | Status: AC

## 2014-08-15 NOTE — Discharge Summary (Addendum)
Physician Discharge Summary  Marcia Richardson ZOX:[id number] DOB: [birth year] DOA: 08/12/2014  PCP: No PCP Per Patient  Admit date: 08/12/2014 Discharge date: 08/15/2014  Recommendations for Outpatient Follow-up:  1.  Follow up with community health and wellness within 1-2 weeks.  Patient to call on Monday to schedule an appointment.   2.  Please check HIV, hepatitis B, RPR 3.  F/u urine GC/Ch.  She received 1 dose of flagyl 2gm for her trichomonas infection 4.  Referral to GYN for menorrhagia  5.  Referral to GI for thickening of distal small bowel in setting of anemia with intermittent abdominal pains  4.  Continue ciprofloxacin to complete a 1 week course of antibiotics  Discharge Diagnoses:  Principal Problem:   Pyelonephritis Active Problems:   Sepsis   Leukocytosis   Anemia, iron deficiency   Menorrhagia   Inadequate pain control   Trichomonas infection   Discharge Condition: stable, improved  Diet recommendation: regular  Wt Readings from Last 3 Encounters:  08/13/14 49.442 kg (109 lb)  08/11/13 49.8 kg (109 lb 12.6 oz)  06/03/13 51.2 kg (112 lb 14 oz)    History of present illness:  Marcia Richardson is a 33 y.o. female presented with pain in her abdomen and back with dysuria.  Patient states she has had diarrhea and nausea.  In the ED she was given pain control and antibiotics and she was admitted for ongoing treatment.    Hospital Course:   Sepsis due to pyelonephritis.  Bilateral pyelonephritis was confirmed by CT scan abd/pelvis.  She was started on ceftriaxone and her WBC trended down.  Her initial urine culture grew mixed flora so her UA and urine culture were repeated and her second urine culture is pending.  Trichomonas was seen on her second UA.  She should continue ciprofloxacin for 5 more days.  She was given a prescription for hydrocodone as needed for pain and zofran as needed for nausea.    Intermittent abdominal pains.  The patient was extremely anxious  about having kidney cancer because a cousin has kidney cancer.  CT scan and pelvic US from 1 year ago demonstrated no evidence of malignancy, but the patient also described intermittent abdominal pains, heavy periods, irregular bowel habits and she had severe iron deficiency anemia.  CT scan demonstrated thickening of distal bowel and moderate free fluid in abdomen.  I think her abdominal pain may be secondary to endometriosis, possible STI infections with PID, or even Crohn's disease given the small bowel thickening.  Urine GC/Ch have been sent and are pending.  Recommend she get referrals for GYN and GI.    Menorrhagia, going through a full box of heavy pads in a few days with each period.  Von Willebrand is pending.  She was started on megace until she can follow up with a GYN for ongoing work up and management.  She had a normal pelvic US for the same indication one year ago.  She was advised of the risk of life-threatening blood clots should she smoke while taking these medications.  Defer initiation of OCP to GYN.    Trichomonas.  Received 2gm of flagyl x 1.  GC/Ch pending.  Patient advised to complete STI testing and treatment before further sexual contact and advised to notify partners.  Deferred Hepatitis, syphilis, and HIV testing to PCP.  Leukocytosis, resolved with antibiotics.  Iron deficiency anemia with normal folate and B12 of 678.  Her hemoglobin trended down to 6.3 mg/dl and she received 2  units of PRBC with improvement in her hemoglobin.  Occult stool not sent because she had no BMs during admission.  She was started on TID iron supplementation and advised to f/u with GYN and GI for further evaluatin of her pain and anemia.    Hypokalemia due to diarrhea and vomiting, resolved with IVF   Consultants:  none Procedures:  10/9 CT abd/pelvis: Bilateral pyelonephritis, moderate free fluid in the abdomen, distal small bowel wall thickening Antibiotics:  Ceftriaxone 10/9   Discharge  Exam: Filed Vitals:   08/15/14 0900  BP: 124/75  Pulse: 53  Temp: 97.8 F (36.6 C)  Resp: 16   Filed Vitals:   08/14/14 1755 08/14/14 2200 08/15/14 0557 08/15/14 0900  BP: 111/73 120/68 122/67 124/75  Pulse: 52 55 56 53  Temp: 97.9 F (36.6 C) 98 F (36.7 C) 97.7 F (36.5 C) 97.8 F (36.6 C)  TempSrc: Oral Oral Oral   Resp: 18 16 16 16   Height:      Weight:      SpO2: 100% 99% 100% 100%    General: Thin BF, No acute distress  HEENT: NCAT, MMM Cardiovascular: RRR, nl S1, S2 no mrg, 2+ pulses, warm extremities  Respiratory: CTAB, no increased WOB  Abdomen: NABS, soft, ND, TTP in RLQ and suprapubic area without rebound or guarding, + left flank pain  MSK: Normal tone and bulk, no LEE  Neuro: Grossly intact   Discharge Instructions      Discharge Instructions   Call MD for:  difficulty breathing, headache or visual disturbances    Complete by:  As directed      Call MD for:  extreme fatigue    Complete by:  As directed      Call MD for:  hives    Complete by:  As directed      Call MD for:  persistant dizziness or light-headedness    Complete by:  As directed      Call MD for:  persistant nausea and vomiting    Complete by:  As directed      Call MD for:  severe uncontrolled pain    Complete by:  As directed      Call MD for:  temperature >100.4    Complete by:  As directed      Diet general    Complete by:  As directed      Discharge instructions    Complete by:  As directed   You were hospitalized with pyelonephritis or kidney infection.  Please continue ciprofloxacin for the next 5 days, next dose is tomorrow morning, for your kidney infection.  You may use hydrocodone as needed for pain and zofran as needed for nausea.  You were found to have trichomonas, a sexually transmitted disease, and you have completed treatment in the hospital.  You have also been tested for gonorrhea and chlamydia.  If these tests come back positive, I will call you to let you know and  I will call in prescriptions if needed.  Make sure you tell your partners of your infection and do not have sex until you and they have been fully tested and treated.  Have your primary care doctor or gynecologist do the additional tests for sexually transmitted diseases.  For your heavy periods, please see a gynecologist.  Take megace twice a day in the mean time and DO NOT SMOKE WHILE TAKING THIS MEDICATION.  Take iron supplements three times a day to help prevent worsening anemia  or low blood counts.  Also get a referral to a gastroenterologist because of what we saw on your CAT scan:  Thickening of the bowel (intestinal) wall.     Driving Restrictions    Complete by:  As directed   Do not drive or operate heavy machinery while taking hydrocodone.     Increase activity slowly    Complete by:  As directed             Medication List         ciprofloxacin 500 MG tablet  Commonly known as:  CIPRO  Take 1 tablet (500 mg total) by mouth 2 (two) times daily.     docusate sodium 100 MG capsule  Commonly known as:  COLACE  Take 1 capsule (100 mg total) by mouth 2 (two) times daily.     ferrous sulfate 325 (65 FE) MG EC tablet  Take 1 tablet (325 mg total) by mouth 3 (three) times daily with meals.     HYDROcodone-acetaminophen 5-325 MG per tablet  Commonly known as:  NORCO/VICODIN  Take 1 tablet by mouth every 6 (six) hours as needed for moderate pain or severe pain.     megestrol 20 MG tablet  Commonly known as:  MEGACE  Take 1 tablet (20 mg total) by mouth 2 (two) times daily.     ondansetron 4 MG tablet  Commonly known as:  ZOFRAN  Take 1 tablet (4 mg total) by mouth every 8 (eight) hours as needed for nausea or vomiting.       Follow-up Information   Follow up with [facility]. (Call on Monday at 9am for hospital follow up appointment)    Contact information:   [address] [place] [phone]      The results of  significant diagnostics from this hospitalization (including imaging, microbiology, ancillary and laboratory) are listed below for reference.    Significant Diagnostic Studies: Ct Abdomen Pelvis W Contrast  08/13/2014   CLINICAL DATA:  Recurrent pyelonephritis.  Diffuse abdominal pain.  EXAM: CT ABDOMEN AND PELVIS WITH CONTRAST  TECHNIQUE: Multidetector CT imaging of the abdomen and pelvis was performed using the standard protocol following bolus administration of intravenous contrast.  CONTRAST:  100mL OMNIPAQUE IOHEXOL 300 MG/ML  SOLN  COMPARISON:  CT 08/11/2013  FINDINGS: Visualization of the lower thorax demonstrates no consolidative or nodular pulmonary opacities. Normal heart size.  Liver is normal in size and contour. Probable 5 mm flash filling hemangioma within the left hepatic lobe. Fatty deposition adjacent to the falciform ligament. Spleen is unremarkable. Pancreas is unremarkable. Normal adrenal glands.  Multiple patchy areas of hypoattenuation are demonstrated within the right and left kidneys. This is more focally evident within the interpolar region of the right kidney and superior/inferior poles of the left kidney.  Normal caliber abdominal aorta. No retroperitoneal lymphadenopathy. Urinary bladder is unremarkable. Small to moderate amount of free fluid in the pelvis. Adnexal structures are unremarkable.  No evidence for bowel obstruction. No free intraperitoneal air. Suggestion of mild thickening of the distal small bowel with hyperenhancement.  Probable injection granulomata within the subcutaneous fat of the anterior abdominal wall. Normal appendix.  No aggressive or acute appearing osseous lesions.  IMPRESSION: 1. Heterogeneous enhancement of the bilateral kidneys suggestive of pyelonephritis. 2. Suggestion of mild thickening of the distal small bowel and associated hyper enhancement. While the enhancement may be secondary to the phase of contrast, recommend correlation as the findings may  be secondary to enteritis. 3. Small to moderate amount of free fluid in the pelvis, more than expected for physiologic fluid.   Electronically Signed   By: Annia Belt M.D.   On: 08/13/2014 15:18    Microbiology: Recent Results (from the past 240 hour(s))  URINE CULTURE     Status: None   Collection Time    08/13/14 12:06 AM      Result Value Ref Range Status   Specimen Description URINE, CLEAN CATCH   Final   Special Requests NONE   Final   Culture  Setup Time     Final   Value: 08/13/2014 04:16     Performed at [facility] Count     Final   Value: >=100,000 COLONIES/ML     Performed at [facility]   Culture     Final   Value: Multiple bacterial morphotypes present, none predominant. Suggest appropriate recollection if clinically indicated.     Performed at [facility]   Report Status 08/14/2014 FINAL   Final     Labs: Basic Metabolic Panel:  Recent Labs Lab 08/12/14 2158 08/13/14 0538 08/14/14 0751 08/15/14 0547  NA 135* 140 139 138  K 3.4* 3.6* 4.2 3.8  CL 100 104 106 104  CO2 21 21 23 23   GLUCOSE 79 80 88 78  BUN 13 13 4* 5*  CREATININE 0.68 0.66 0.62 0.69  CALCIUM 9.7 8.2* 8.5 8.8   Liver Function Tests:  Recent Labs Lab 08/13/14 0538  AST 13  ALT 6  ALKPHOS 58  BILITOT 0.4  PROT 6.8  ALBUMIN 3.5   No results found for this basename: LIPASE, AMYLASE,  in the last 168 hours No results found for this basename: AMMONIA,  in the last 168 hours CBC:  Recent Labs Lab 08/12/14 2158 08/13/14 0538 08/14/14 0751 08/15/14 0547  WBC 17.1* 11.7* 5.8 7.1  NEUTROABS 14.9*  --   --   --   HGB 8.6* 7.1* 6.3* 10.0*  HCT 29.7* 24.4* 21.7* 31.4*  MCV 65.3* 65.6* 67.0* 70.7*  PLT 522* 410* 331 321   Cardiac Enzymes: No results found for this basename: CKTOTAL, CKMB, CKMBINDEX, TROPONINI,  in the last 168 hours BNP: BNP (last 3 results) No results found for this basename: PROBNP,  in the last 8760 hours CBG:  Recent  Labs Lab 08/13/14 0741 08/14/14 0812  GLUCAP 85 84    Time coordinating discharge: 35 minutes  Signed:  Hadlee Burback  [facility] 08/15/2014, 1:12 PM

## 2014-08-15 NOTE — Progress Notes (Signed)
Pt discharged to home. DC instructions given with female family member at bedside. No concerns voiced. Left unit in good condition ambulating to checkout accompanied by female family member. Pt refused to be wheeled downstairs. Left unit without prescription. Tried calling home number on chart but no answer. Left a message. Pt's sister called back and gave current phone number which was called and then pt answered the phone. Pt said she would find someone to bring her to hospital so that she can pick up the prescriptions. Prescriptions given to Darl Pikes, Pharmacist, community who has them at the desk. Vwilliams,rn.

## 2014-08-16 LAB — TRANSFERRIN: Transferrin: 303 mg/dL (ref 200–360)

## 2014-08-17 ENCOUNTER — Telehealth: Payer: Self-pay | Admitting: Internal Medicine

## 2014-08-17 LAB — GC/CHLAMYDIA PROBE AMP
CT PROBE, AMP APTIMA: NEGATIVE
GC Probe RNA: NEGATIVE

## 2014-08-17 LAB — HEMOGLOBINOPATHY EVALUATION
HGB A2 QUANT: 2 % — AB (ref 2.2–3.2)
Hemoglobin Other: 0 %
Hgb A: 98 % — ABNORMAL HIGH (ref 96.8–97.8)
Hgb F Quant: 0 % (ref 0.0–2.0)
Hgb S Quant: 0 %

## 2014-08-17 NOTE — Telephone Encounter (Signed)
RN did not give her prescriptions when she was discharged.  Prescriptions were kept at nursing station for her to pick up but she requested they be called to local pharmacy for pick up instead.  Called all prescriptions to [facility] on [address] except for hydrocodone.

## 2014-08-20 LAB — VON WILLEBRAND PANEL
COAGULATION FACTOR VIII: 121 % (ref 73–140)
Ristocetin Co-factor, Plasma: 116 % (ref 42–200)
VON WILLEBRAND ANTIGEN, PLASMA: 180 % (ref 50–217)

## 2014-08-25 ENCOUNTER — Ambulatory Visit: Payer: Self-pay | Attending: Internal Medicine | Admitting: Internal Medicine

## 2014-08-25 ENCOUNTER — Encounter: Payer: Self-pay | Admitting: Internal Medicine

## 2014-08-25 ENCOUNTER — Encounter: Payer: Self-pay | Admitting: Obstetrics & Gynecology

## 2014-08-25 VITALS — BP 120/80 | HR 78 | Temp 98.2°F | Resp 16 | Ht 60.0 in | Wt 122.0 lb

## 2014-08-25 DIAGNOSIS — N922 Excessive menstruation at puberty: Secondary | ICD-10-CM

## 2014-08-25 DIAGNOSIS — Z139 Encounter for screening, unspecified: Secondary | ICD-10-CM

## 2014-08-25 DIAGNOSIS — N920 Excessive and frequent menstruation with regular cycle: Secondary | ICD-10-CM | POA: Insufficient documentation

## 2014-08-25 DIAGNOSIS — N946 Dysmenorrhea, unspecified: Secondary | ICD-10-CM

## 2014-08-25 DIAGNOSIS — Z87891 Personal history of nicotine dependence: Secondary | ICD-10-CM | POA: Insufficient documentation

## 2014-08-25 DIAGNOSIS — E876 Hypokalemia: Secondary | ICD-10-CM

## 2014-08-25 DIAGNOSIS — Z13820 Encounter for screening for osteoporosis: Secondary | ICD-10-CM | POA: Insufficient documentation

## 2014-08-25 DIAGNOSIS — D509 Iron deficiency anemia, unspecified: Secondary | ICD-10-CM

## 2014-08-25 DIAGNOSIS — N12 Tubulo-interstitial nephritis, not specified as acute or chronic: Secondary | ICD-10-CM

## 2014-08-25 LAB — POCT URINALYSIS DIPSTICK
Bilirubin, UA: NEGATIVE
Glucose, UA: NEGATIVE
Ketones, UA: NEGATIVE
Leukocytes, UA: NEGATIVE
NITRITE UA: NEGATIVE
PH UA: 6.5
Protein, UA: NEGATIVE
Spec Grav, UA: 1.015
Urobilinogen, UA: 2

## 2014-08-25 NOTE — Progress Notes (Signed)
Patient Demographics  Marcia Richardson, is a 33 y.o. female  KLK:[id number]  PVX:[id number]  DOB - [birth year]  CC:  Chief Complaint  Patient presents with  . Hospitalization Follow-up  . Pyelonephritis       HPI: Marcia Richardson is a 33 y.o. female here today to establish medical care.Patient was recently hospitalized her with the symptoms of abdominal pain dysuria, EMR reviewed patient was found to be in sepsis due to pyelonephritis, she had bilateral pyelonephritis confirmed on CT scan, she was started on IV antibiotics, she was also treated for trichomonas with Flagyl and continued her with Cipro, also prescribed Zofran for nausea, patient also reported to have menorrhagia, was prescribed Megace and was advised to follow with the GYN, her blood work was consistent with iron deficiency anemia and she received 2 units of PRBCs and was started on-iron supplements, patient also had hypokalemia secondary to diarrhea. Patient denies any more diarrhea currently does have some minimal abdominal pain denies any nausea vomiting, denies any fever chills or any more urinary symptoms, she is going to finish the course of antibiotic. As per patient her partner was also treated for trichomonas. Patient has No headache, No chest pain, No abdominal pain - No Nausea, No new weakness tingling or numbness, No Cough - SOB.  No Known Allergies Past Medical History  Diagnosis Date  . Ectopic pregnancy     2007   Current Outpatient Prescriptions on File Prior to Visit  Medication Sig Dispense Refill  . ciprofloxacin (CIPRO) 500 MG tablet Take 1 tablet (500 mg total) by mouth 2 (two) times daily.  10 tablet  0  . docusate sodium (COLACE) 100 MG capsule Take 1 capsule (100 mg total) by mouth 2 (two) times daily.  60 capsule  0  . ferrous sulfate 325 (65 FE) MG EC tablet Take 1 tablet (325 mg total) by mouth 3 (three) times daily with meals.  90 tablet  0  . HYDROcodone-acetaminophen (NORCO/VICODIN)  5-325 MG per tablet Take 1 tablet by mouth every 6 (six) hours as needed for moderate pain or severe pain.  30 tablet  0  . megestrol (MEGACE) 20 MG tablet Take 1 tablet (20 mg total) by mouth 2 (two) times daily.  60 tablet  0  . ondansetron (ZOFRAN) 4 MG tablet Take 1 tablet (4 mg total) by mouth every 8 (eight) hours as needed for nausea or vomiting.  20 tablet  0   No current facility-administered medications on file prior to visit.   Family History  Problem Relation Age of Onset  . Cancer Mother     lung cancer   . Stroke Father   . Cancer Maternal Grandmother     breast cancer   . Cancer Paternal Grandfather     throat cancer    History   Social History  . Marital Status: Single    Spouse Name: N/A    Number of Children: N/A  . Years of Education: N/A   Occupational History  . Not on file.   Social History Main Topics  . Smoking status: Former Smoker    Quit date: 05/28/2013  . Smokeless tobacco: Never Used  . Alcohol Use: No  . Drug Use: Yes    Special: Marijuana  . Sexual Activity: No   Other Topics Concern  . Not on file   Social History Narrative  . No narrative on file    Review of Systems: Constitutional: Negative for fever, chills, diaphoresis, activity  change, appetite change and fatigue. HENT: Negative for ear pain, nosebleeds, congestion, facial swelling, rhinorrhea, neck pain, neck stiffness and ear discharge.  Eyes: Negative for pain, discharge, redness, itching and visual disturbance. Respiratory: Negative for cough, choking, chest tightness, shortness of breath, wheezing and stridor.  Cardiovascular: Negative for chest pain, palpitations and leg swelling. Gastrointestinal: Negative for abdominal distention. Genitourinary: Negative for dysuria, urgency, frequency, hematuria, flank pain, decreased urine volume, difficulty urinating and dyspareunia.  Musculoskeletal: Negative for back pain, joint swelling, arthralgia and gait problem. Neurological:  Negative for dizziness, tremors, seizures, syncope, facial asymmetry, speech difficulty, weakness, light-headedness, numbness and headaches.  Hematological: Negative for adenopathy. Does not bruise/bleed easily. Psychiatric/Behavioral: Negative for hallucinations, behavioral problems, confusion, dysphoric mood, decreased concentration and agitation.    Objective:   Filed Vitals:   08/25/14 0923  BP: 120/80  Pulse: 78  Temp: 98.2 F (36.8 C)  Resp: 16    Physical Exam: Constitutional: Patient appears well-developed and well-nourished. No distress. HENT: Normocephalic, atraumatic, External right and left ear normal. Oropharynx is clear and moist.  Eyes: Conjunctivae and EOM are normal. PERRLA, no scleral icterus. Neck: Normal ROM. Neck supple. No JVD. No tracheal deviation. No thyromegaly. CVS: RRR, S1/S2 +, no murmurs, no gallops, no carotid bruit.  Pulmonary: Effort and breath sounds normal, no stridor, rhonchi, wheezes, rales.  Abdominal: Soft. BS +, no distension, tenderness, rebound or guarding.  Musculoskeletal: Normal range of motion. No edema and no tenderness.  Neuro: Alert. Normal reflexes, muscle tone coordination. No cranial nerve deficit. Skin: Skin is warm and dry. No rash noted. Not diaphoretic. No erythema. No pallor. Psychiatric: Normal mood and affect. Behavior, judgment, thought content normal.  Lab Results  Component Value Date   WBC 7.1 08/15/2014   HGB 10.0* 08/15/2014   HCT 31.4* 08/15/2014   MCV 70.7* 08/15/2014   PLT 321 08/15/2014   Lab Results  Component Value Date   CREATININE 0.69 08/15/2014   BUN 5* 08/15/2014   NA 138 08/15/2014   K 3.8 08/15/2014   CL 104 08/15/2014   CO2 23 08/15/2014    Lab Results  Component Value Date   HGBA1C 4.1 08/13/2014   Lipid Panel  No results found for this basename: chol, trig, hdl, cholhdl, vldl, ldlcalc       Assessment and plan:   1. Pyelonephritis Patient is on Cipro and will finish the course of  antibiotic today Results for orders placed in visit on 08/25/14  POCT URINALYSIS DIPSTICK      Result Value Ref Range   Color, UA yellow     Clarity, UA clear     Glucose, UA neg     Bilirubin, UA neg     Ketones, UA neg     Spec Grav, UA 1.015     Blood, UA trace-intact     pH, UA 6.5     Protein, UA neg     Urobilinogen, UA 2.0     Nitrite, UA neg     Leukocytes, UA Negative      - Urinalysis Dipstick negative for nitrites and leukocyte esterase  2. Hypokalemia  - COMPLETE METABOLIC PANEL WITH GFR  3. Anemia, iron deficiency Advise patient to take iron supplement 3 times a day, will repeat CBC in 3 months.  4. Dysmenorrhea/menorrhagia  - Ambulatory referral to Obstetrics / Gynecology  5. Screening  - Vit D  25 hydroxy (rtn osteoporosis monitoring)      Health Maintenance : -Pap Smear: referred to GYN  Return in about 3 months (around 11/25/2014) for anemia.   Doris CheadleADVANI, Korina Tretter, MD

## 2014-08-25 NOTE — Progress Notes (Signed)
Pt comes in per HFU- s/p Pyelonephritis treated with antibiotics and pain management Pt d/c'd on Ciprofloxacin 500 mg tab with 1 more tab to complete C/o Right pelvic pain,nonradiating Denies n/v/ or fevers LMP- 08/05/2014 Urine Dipstick negative C/o joint pain

## 2014-10-08 ENCOUNTER — Encounter: Payer: Self-pay | Admitting: Obstetrics & Gynecology

## 2016-04-06 ENCOUNTER — Encounter (HOSPITAL_COMMUNITY): Payer: Self-pay | Admitting: Oncology

## 2016-04-06 ENCOUNTER — Emergency Department (HOSPITAL_COMMUNITY): Payer: Self-pay

## 2016-04-06 ENCOUNTER — Emergency Department (HOSPITAL_COMMUNITY)
Admission: EM | Admit: 2016-04-06 | Discharge: 2016-04-07 | Disposition: A | Discharge status: Home / Self Care | Payer: Self-pay | Attending: Emergency Medicine | Admitting: Emergency Medicine

## 2016-04-06 DIAGNOSIS — R109 Unspecified abdominal pain: Secondary | ICD-10-CM | POA: Insufficient documentation

## 2016-04-06 DIAGNOSIS — R0789 Other chest pain: Secondary | ICD-10-CM | POA: Insufficient documentation

## 2016-04-06 DIAGNOSIS — Z87891 Personal history of nicotine dependence: Secondary | ICD-10-CM | POA: Insufficient documentation

## 2016-04-06 LAB — BASIC METABOLIC PANEL
Anion gap: 12 (ref 5–15)
BUN: 8 mg/dL (ref 6–20)
CO2: 20 mmol/L — ABNORMAL LOW (ref 22–32)
CREATININE: 0.65 mg/dL (ref 0.44–1.00)
Calcium: 9.6 mg/dL (ref 8.9–10.3)
Chloride: 106 mmol/L (ref 101–111)
GFR calc Af Amer: >60 mL/min (ref >60)
GLUCOSE: 100 mg/dL — AB (ref 65–99)
POTASSIUM: 3.2 mmol/L — AB (ref 3.5–5.1)
SODIUM: 138 mmol/L (ref 135–145)

## 2016-04-06 LAB — I-STAT TROPONIN, ED: Troponin i, poc: 0 ng/mL (ref 0.00–0.08)

## 2016-04-06 LAB — CBC
HEMATOCRIT: 34 % — AB (ref 36.0–46.0)
Hemoglobin: 11.2 g/dL — ABNORMAL LOW (ref 12.0–15.0)
MCH: 26.5 pg (ref 26.0–34.0)
MCHC: 32.9 g/dL (ref 30.0–36.0)
MCV: 80.6 fL (ref 78.0–100.0)
PLATELETS: 375 10*3/uL (ref 150–400)
RBC: 4.22 MIL/uL (ref 3.87–5.11)
RDW: 18 % — AB (ref 11.5–15.5)
WBC: 10.4 10*3/uL (ref 4.0–10.5)

## 2016-04-06 MED ORDER — SODIUM CHLORIDE 0.9 % IV BOLUS (SEPSIS)
1000.0000 mL | Freq: Once | INTRAVENOUS | Status: AC
  Administered 2016-04-07: 1000 mL via INTRAVENOUS

## 2016-04-06 MED ORDER — IBUPROFEN 200 MG PO TABS: 400.0000 mg | ORAL_TABLET | Freq: Once | ORAL | Status: DC | PRN

## 2016-04-06 MED ORDER — ONDANSETRON HCL 4 MG/2ML IJ SOLN
4.0000 mg | Freq: Once | INTRAMUSCULAR | Status: AC
  Administered 2016-04-07: 4 mg via INTRAVENOUS
  Filled 2016-04-06: qty 2

## 2016-04-06 MED ORDER — MORPHINE SULFATE (PF) 4 MG/ML IV SOLN
4.0000 mg | Freq: Once | INTRAVENOUS | Status: AC
  Administered 2016-04-07: 4 mg via INTRAVENOUS
  Filled 2016-04-06: qty 1

## 2016-04-06 NOTE — ED Provider Notes (Signed)
CSN: [id number]     Arrival date & time 04/06/16  2014 History   By signing my name below, I, Suzan Slick. Elon Spanner, attest that this documentation has been prepared under the direction and in the presence of Richardean Canal, MD.  Electronically Signed: Suzan Slick. Elon Spanner, ED Scribe. 04/06/2016. 12:14 AM.   Chief Complaint  Patient presents with  . Chest Pain   The history is provided by the patient. No language interpreter was used.    HPI Comments: Marcia Richardson brought in by EMS is a 35 y.o. female with a PMHx of kidney infections who presents to the Emergency Department complaining of constant, unchanged central chest pain x 3 hours. Per triage note, chest pain is made worse with inspiration. No alleviating factors. She also reports lower back pain that radiates to bilateral flanks along with a fever measured as "in the 90's" at home. Pt states current symptom feel similar to kidney infection diagnosed in 2013 which required hospital admission. In addition, pt states she was getting up this morning and states she passed out. No dysuria or hematuria.  PCP: Doris Cheadle, MD    Past Medical History  Diagnosis Date  . Ectopic pregnancy     2007   Past Surgical History  Procedure Laterality Date  . Cesarean section     Family History  Problem Relation Age of Onset  . Cancer Mother     lung cancer   . Stroke Father   . Cancer Maternal Grandmother     breast cancer   . Cancer Paternal Grandfather     throat cancer    Social History  Substance Use Topics  . Smoking status: Former Smoker    Quit date: 05/28/2013  . Smokeless tobacco: Never Used  . Alcohol Use: No   OB History    No data available     Review of Systems  Constitutional: Positive for fever. Negative for chills.  Cardiovascular: Positive for chest pain.  Genitourinary: Positive for flank pain.  Musculoskeletal: Positive for back pain.  All other systems reviewed and are negative.     Allergies  Review of  patient's allergies indicates no known allergies.  Home Medications   Prior to Admission medications   Medication Sig Start Date End Date Taking? Authorizing Provider  ciprofloxacin (CIPRO) 500 MG tablet Take 1 tablet (500 mg total) by mouth 2 (two) times daily. Patient not taking: Reported on 04/06/2016 08/15/14   Renae Fickle, MD  docusate sodium (COLACE) 100 MG capsule Take 1 capsule (100 mg total) by mouth 2 (two) times daily. Patient not taking: Reported on 04/06/2016 08/15/14   Renae Fickle, MD  ferrous sulfate 325 (65 FE) MG EC tablet Take 1 tablet (325 mg total) by mouth 3 (three) times daily with meals. Patient not taking: Reported on 04/06/2016 08/15/14   Renae Fickle, MD  HYDROcodone-acetaminophen (NORCO/VICODIN) 5-325 MG per tablet Take 1 tablet by mouth every 6 (six) hours as needed for moderate pain or severe pain. Patient not taking: Reported on 04/06/2016 08/15/14   Renae Fickle, MD  megestrol (MEGACE) 20 MG tablet Take 1 tablet (20 mg total) by mouth 2 (two) times daily. Patient not taking: Reported on 04/06/2016 08/15/14   Renae Fickle, MD  ondansetron (ZOFRAN) 4 MG tablet Take 1 tablet (4 mg total) by mouth every 8 (eight) hours as needed for nausea or vomiting. 04/07/16   Richardean Canal, MD  traMADol (ULTRAM) 50 MG tablet Take 1 tablet (50 mg total) by  mouth every 6 (six) hours as needed. 04/07/16   Richardean Canal, MD   Triage Vitals: BP 84/46 mmHg  Pulse 71  Temp(Src) 100 F (37.8 C) (Oral)  Resp 19  SpO2 98%  LMP 03/20/2016 (Exact Date)   Physical Exam  Constitutional: She is oriented to person, place, and time. She appears well-developed and well-nourished. No distress.  HENT:  Head: Normocephalic and atraumatic.  Eyes: EOM are normal.  Neck: Normal range of motion.  Cardiovascular: Regular rhythm and normal heart sounds.  Tachycardia present.   Pulmonary/Chest: Effort normal and breath sounds normal. She has no wheezes. She has no rales.  Abdominal: Soft. She  exhibits no distension. There is no tenderness.  Genitourinary:  Mild diffuse tenderness worse in bilateral flank area.  Musculoskeletal: Normal range of motion.  Neurological: She is alert and oriented to person, place, and time.  Skin: Skin is warm and dry.  Psychiatric: She has a normal mood and affect. Judgment normal.  Nursing note and vitals reviewed.   ED Course  Procedures (including critical care time)  DIAGNOSTIC STUDIES: Oxygen Saturation is 100% on RA, Normal by my interpretation.    COORDINATION OF CARE: 11:53 PM- Will order blood work, EKG, imaging. Will give Ibuprofen, Zofran, Morphine, and fluids. Discussed treatment plan with pt at bedside and pt agreed to plan.     Labs Review Labs Reviewed  BASIC METABOLIC PANEL - Abnormal; Notable for the following:    Potassium 3.2 (*)    CO2 20 (*)    Glucose, Bld 100 (*)    All other components within normal limits  CBC - Abnormal; Notable for the following:    Hemoglobin 11.2 (*)    HCT 34.0 (*)    RDW 18.0 (*)    All other components within normal limits  URINALYSIS, ROUTINE W REFLEX MICROSCOPIC (NOT AT [facility]) - Abnormal; Notable for the following:    APPearance CLOUDY (*)    Hgb urine dipstick TRACE (*)    Ketones, ur >80 (*)    All other components within normal limits  URINE MICROSCOPIC-ADD ON - Abnormal; Notable for the following:    Squamous Epithelial / LPF 0-5 (*)    Bacteria, UA RARE (*)    All other components within normal limits  URINE CULTURE  CULTURE, BLOOD (ROUTINE X 2)  CULTURE, BLOOD (ROUTINE X 2)  PREGNANCY, URINE  I-STAT TROPOININ, ED  I-STAT CG4 LACTIC ACID, ED  I-STAT BETA HCG BLOOD, ED ([facility], [facility], [facility] ONLY)    Imaging Review Dg Chest 2 View  04/06/2016  CLINICAL DATA:  Chest pain EXAM: CHEST  2 VIEW COMPARISON:  None. FINDINGS: Lungs are clear. Heart size and pulmonary vascularity are normal. No adenopathy. No pneumothorax. No bone lesions. IMPRESSION: No abnormality noted. Electronically  Signed   By: Bretta Bang III M.D.   On: 04/06/2016 20:55   Ct Renal Stone Study  04/07/2016  CLINICAL DATA:  Lower back pain radiating to the bilateral flanks for 3 hours. EXAM: CT ABDOMEN AND PELVIS WITHOUT CONTRAST TECHNIQUE: Multidetector CT imaging of the abdomen and pelvis was performed following the standard protocol without IV contrast. COMPARISON:  08/23/2014 FINDINGS: Lower chest and abdominal wall:  No contributory findings. Hepatobiliary: No focal liver abnormality.No evidence of biliary obstruction or stone. Pancreas: Unremarkable. Spleen: Unremarkable. Adrenals/Urinary Tract: Negative adrenals. No hydronephrosis or stone. Unremarkable bladder. Reproductive:No pathologic findings. Probable dominant follicle on the right. Stomach/Bowel:  No obstruction. No appendicitis. Vascular/Lymphatic: Early atherosclerotic calcification on the aorta.  No acute vascular abnormality. No mass or adenopathy. Peritoneal: No ascites or pneumoperitoneum. Musculoskeletal: Negative. IMPRESSION: No acute finding.  No hydronephrosis or urolithiasis. Electronically Signed   By: Marnee Spring M.D.   On: 04/07/2016 01:08   I have personally reviewed and evaluated these images and lab results as part of my medical decision-making.   EKG Interpretation   Date/Time:  Friday April 06 2016 20:26:58 EDT Ventricular Rate:  73 PR Interval:  120 QRS Duration: 97 QT Interval:  462 QTC Calculation: 509 R Axis:   33 Text Interpretation:  Sinus rhythm Probable left atrial enlargement  Abnormal T, consider ischemia, diffuse leads Baseline wander in lead(s) I  III aVL Confirmed by Ethelda Chick  MD, SAM [id number]) on 04/06/2016 8:34:13 PM      MDM   Final diagnoses:  Flank pain  Other chest pain   Marcia Richardson is a 35 y.o. female here with flank pain, abdominal pain, chest pain. Consider pyelo vs stone. Has low grade temp 100. Will get labs, UA, cultures, lactate, CXR, CT renal stone.   5:22 AM  CT showed no pyelo  or stone. UA showed ketones no UTI or blood. WBC nl.  CT renal stone with no stone. Pain controlled. Don't know why she had sudden onset flank pain. Has low grade temp, maybe she had viral syndrome. Urine and blood cultures sent. Lactate nl. Will dc home.   I personally performed the services described in this documentation, which was scribed in my presence. The recorded information has been reviewed and is accurate.   Richardean Canal, MD 04/07/16 [phone]

## 2016-04-06 NOTE — ED Notes (Signed)
Pt became verbally aggressive, cursing at this writer when I told her I would have to put her in the lobby until a room became available.  Pt also cursed at [place], [place].  Pt stated, "I came in an ambulance, I should be in a room."  Pt then called someone on the phone to come and get her and take her to [facility].

## 2016-04-06 NOTE — ED Notes (Signed)
Per EMS pt was being transported here by [facility] d/t lower back pain that radiated to her b/l flanks x 3 hours.  En route pt began to c/o central chest pain, worse w/ inspiration.  [facility] called to transport pt in.  Per EMS pt's 12 lead was unremarkable.  +nausea.

## 2016-04-07 ENCOUNTER — Emergency Department (HOSPITAL_COMMUNITY): Payer: Self-pay

## 2016-04-07 LAB — URINALYSIS, ROUTINE W REFLEX MICROSCOPIC
Bilirubin Urine: NEGATIVE
Glucose, UA: NEGATIVE mg/dL
LEUKOCYTES UA: NEGATIVE
NITRITE: NEGATIVE
PH: 7 (ref 5.0–8.0)
PROTEIN: NEGATIVE mg/dL
Specific Gravity, Urine: 1.026 (ref 1.005–1.030)

## 2016-04-07 LAB — I-STAT CG4 LACTIC ACID, ED: LACTIC ACID, VENOUS: 1.52 mmol/L (ref 0.5–2.0)

## 2016-04-07 LAB — URINE MICROSCOPIC-ADD ON

## 2016-04-07 LAB — I-STAT BETA HCG BLOOD, ED (MC, WL, AP ONLY)

## 2016-04-07 LAB — PREGNANCY, URINE: Preg Test, Ur: NEGATIVE

## 2016-04-07 MED ORDER — SODIUM CHLORIDE 0.9 % IV BOLUS (SEPSIS)
1000.0000 mL | Freq: Once | INTRAVENOUS | Status: AC
  Administered 2016-04-07: 1000 mL via INTRAVENOUS

## 2016-04-07 MED ORDER — ONDANSETRON HCL 4 MG PO TABS: 4.0000 mg | ORAL_TABLET | Freq: Three times a day (TID) | ORAL | Status: AC | PRN

## 2016-04-07 MED ORDER — ACETAMINOPHEN 500 MG PO TABS: 1000.0000 mg | ORAL_TABLET | Freq: Once | ORAL | Status: DC

## 2016-04-07 MED ORDER — TRAMADOL HCL 50 MG PO TABS: 50.0000 mg | ORAL_TABLET | Freq: Four times a day (QID) | ORAL | Status: AC | PRN

## 2016-04-07 MED ORDER — KETOROLAC TROMETHAMINE 30 MG/ML IJ SOLN
30.0000 mg | Freq: Once | INTRAMUSCULAR | Status: AC
  Administered 2016-04-07: 30 mg via INTRAVENOUS
  Filled 2016-04-07: qty 1

## 2016-04-07 NOTE — Discharge Instructions (Signed)
Stay hydrated.   Take tramadol as needed for flank pain.   See your doctor.   Return to ER if you have worse chest pain, abdominal pain, flank pain.

## 2016-04-08 LAB — URINE CULTURE

## 2016-04-12 LAB — CULTURE, BLOOD (ROUTINE X 2)
CULTURE: NO GROWTH
Culture: NO GROWTH

## 2018-01-17 IMAGING — CT CT RENAL STONE PROTOCOL
2 of 3 series · 17 of 46 positions shown, 19 images · non-contrast
Comparison: 08/23/2014

CLINICAL DATA: Lower back pain radiating to the bilateral flanks
for 3 hours.

EXAM:
CT ABDOMEN AND PELVIS WITHOUT CONTRAST
TECHNIQUE: Multidetector CT imaging of the abdomen and pelvis was performed
following the standard protocol without IV contrast.

[Series 4: lung · axial · 0.73mm/px · z∈[-159,-55]mm · 14 of 60 slices shown, 16 images]
[im 4/60  soft-tissue]
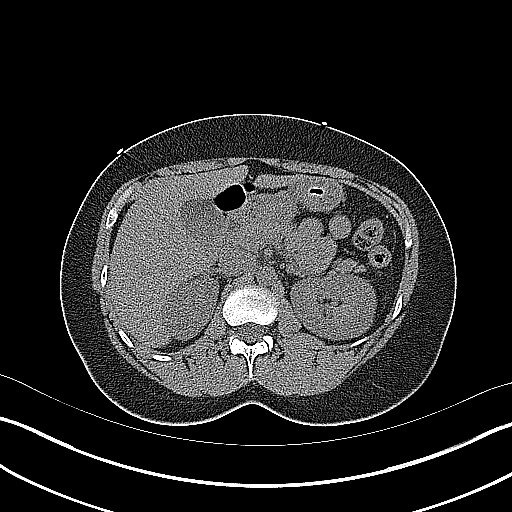
[im 4/60  bone]
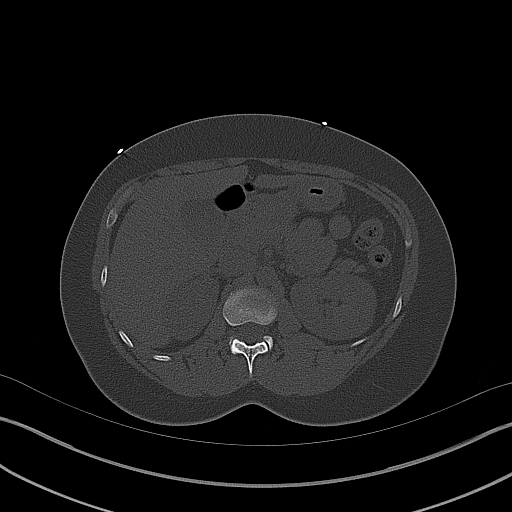
[im 8/60  soft-tissue]
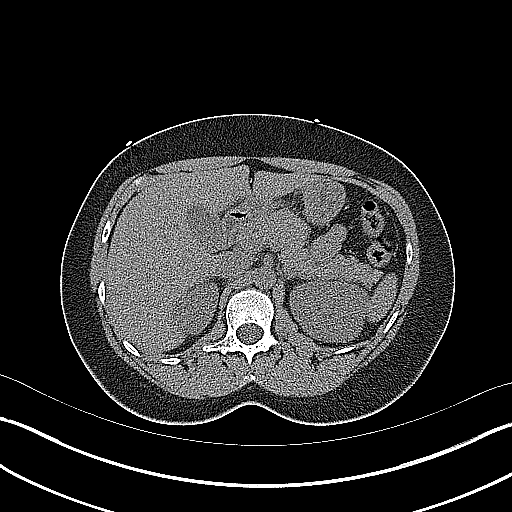
[im 12/60  soft-tissue]
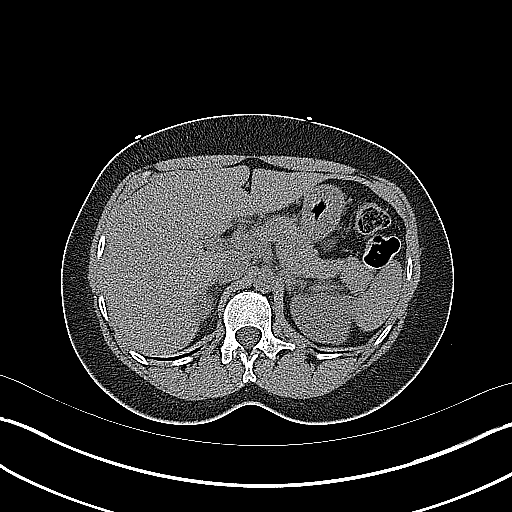
[im 16/60  soft-tissue]
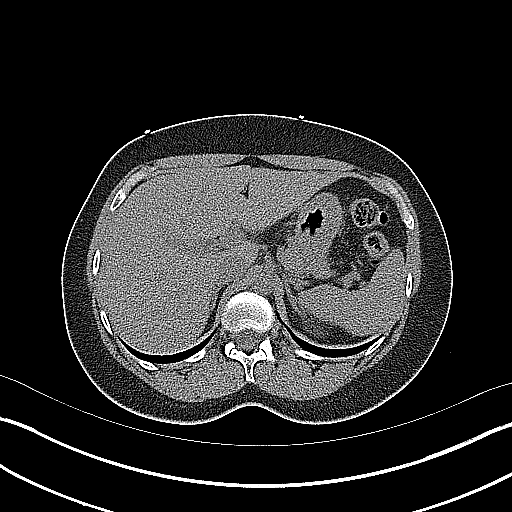
[im 20/60  soft-tissue]
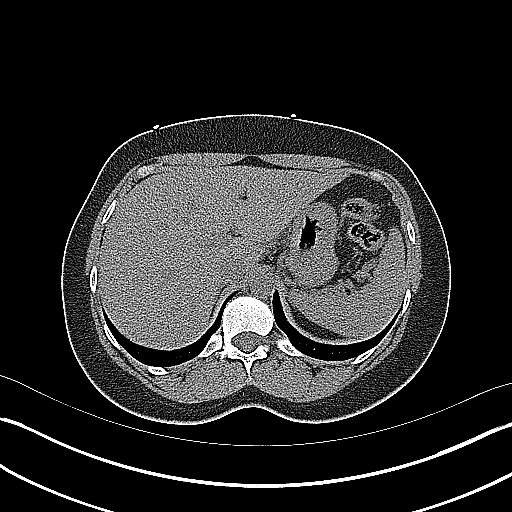
[im 23/60  soft-tissue]
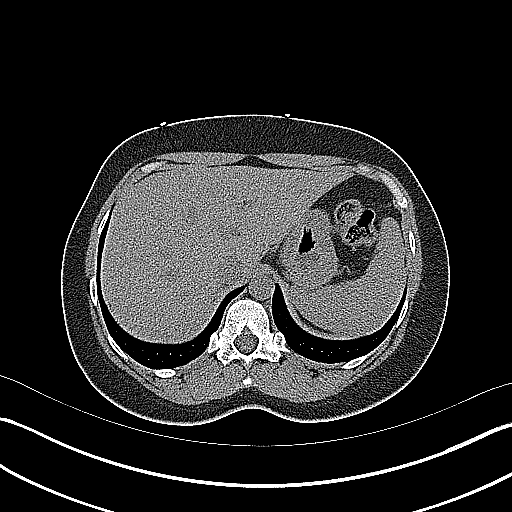
[im 27/60  soft-tissue]
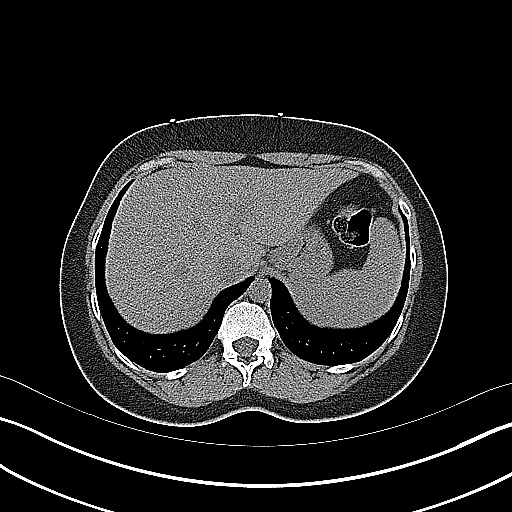
[im 33/60  soft-tissue]
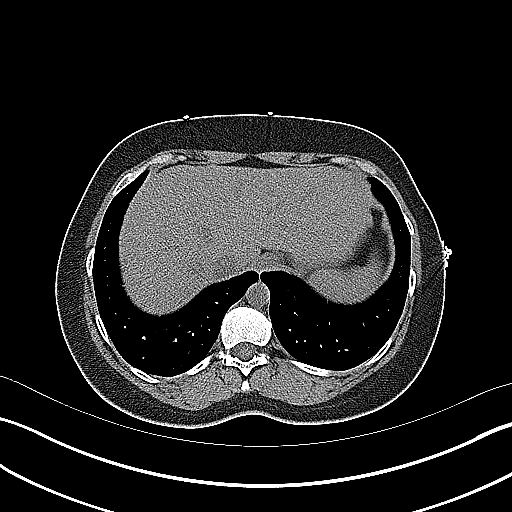
[im 37/60  soft-tissue]
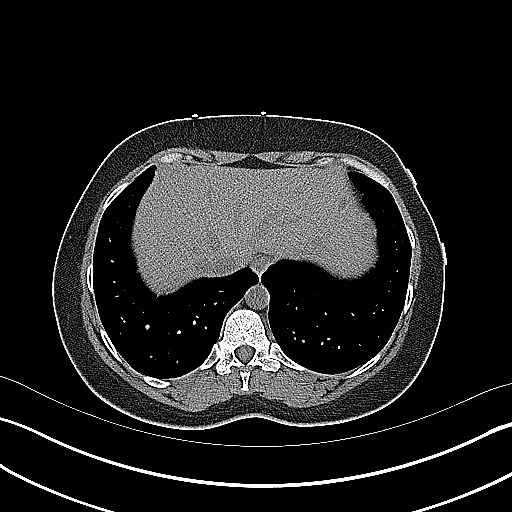
[im 37/60  bone]
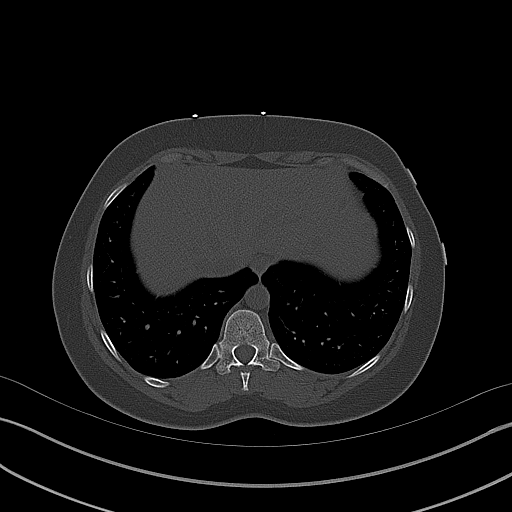
[im 40/60  soft-tissue]
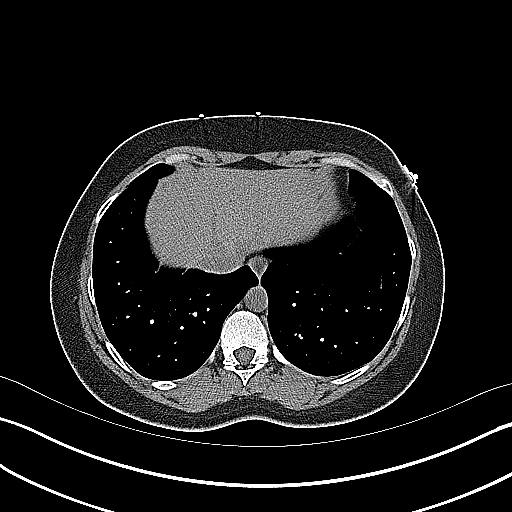
[im 44/60  soft-tissue]
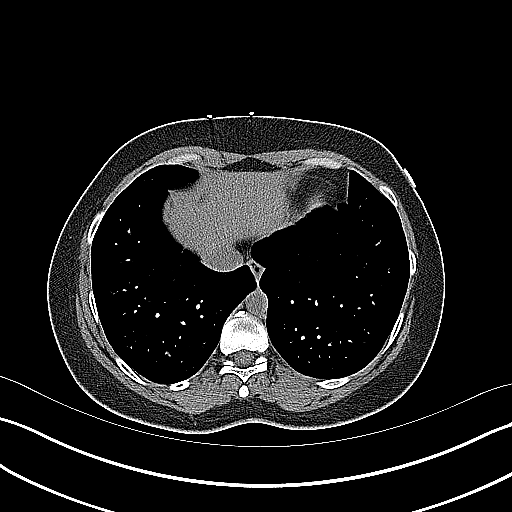
[im 48/60  soft-tissue]
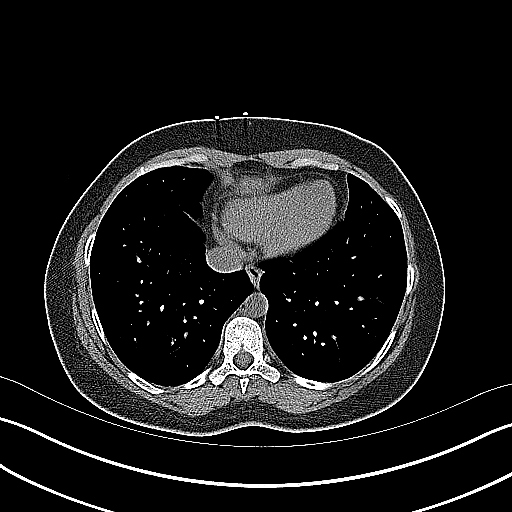
[im 52/60  soft-tissue]
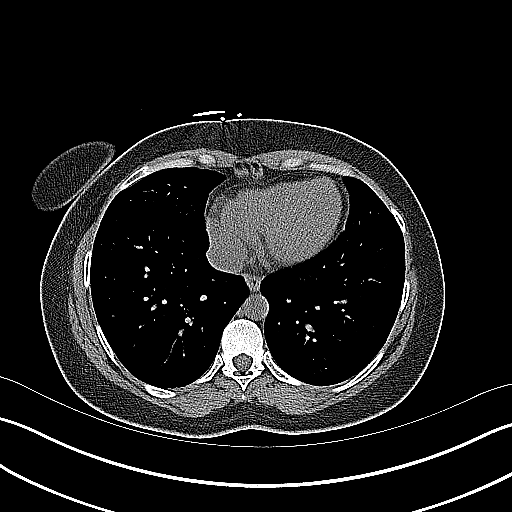
[im 56/60  soft-tissue]
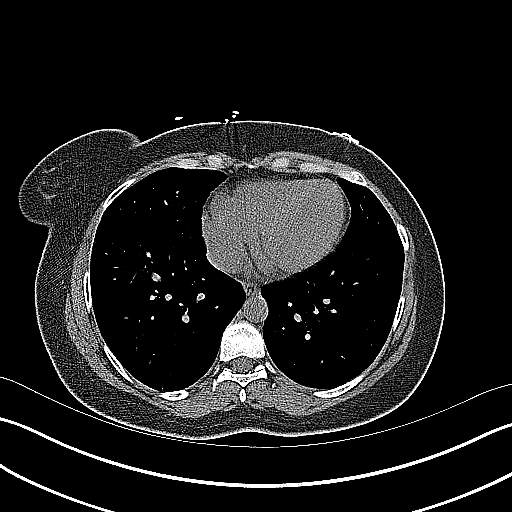

[Series 5: coronal · coronal · 0.67mm/px · 3 of 113 slices shown]
[im 38/113  soft-tissue]
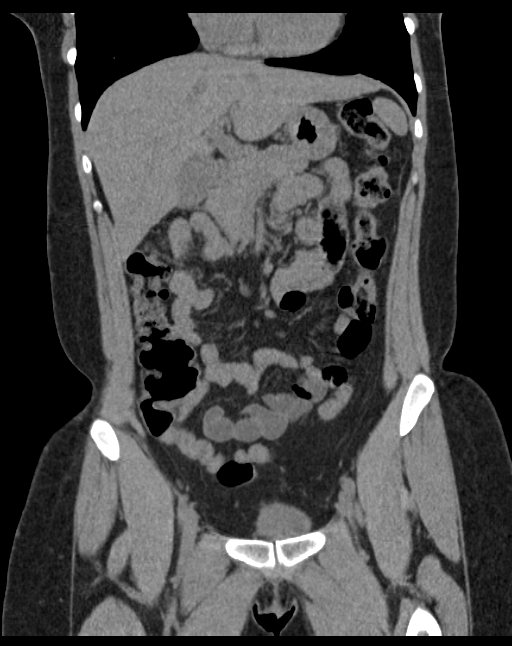
[im 50/113  soft-tissue]
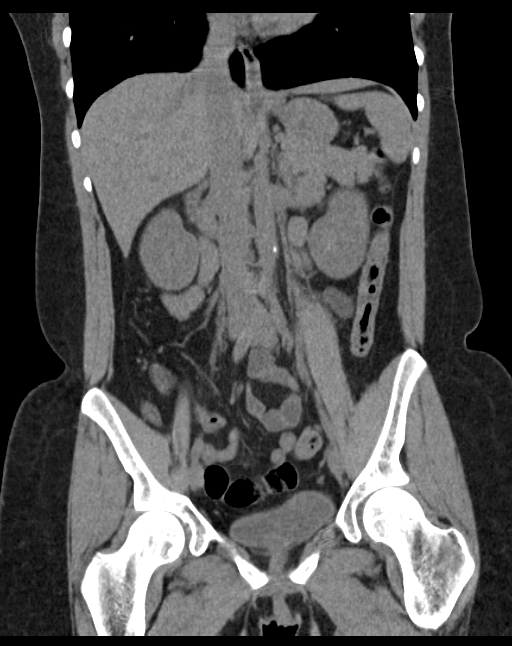
[im 63/113  soft-tissue]
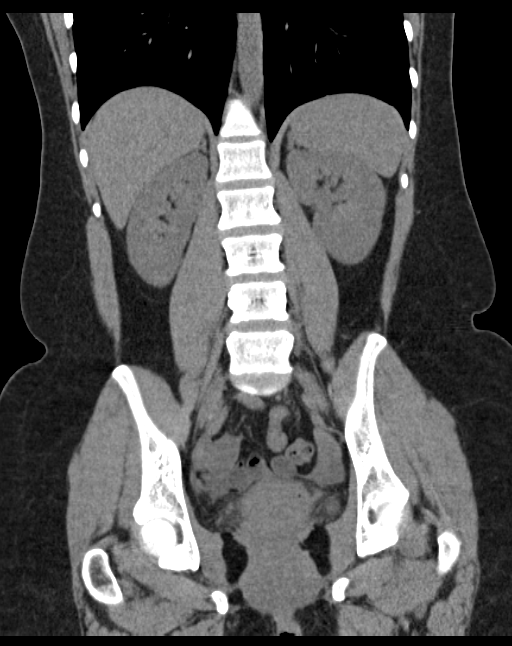

[17 of 46 positions shown; findings below may reference images not displayed]

FINDINGS: Lower chest and abdominal wall:  No contributory findings.

Hepatobiliary: No focal liver abnormality.No evidence of biliary
obstruction or stone.

Pancreas: Unremarkable.

Spleen: Unremarkable.

Adrenals/Urinary Tract: Negative adrenals. No hydronephrosis or
stone. Unremarkable bladder.

Reproductive:No pathologic findings. Probable dominant follicle on
the right.

Stomach/Bowel:  No obstruction. No appendicitis.

Vascular/Lymphatic: Early atherosclerotic calcification on the
aorta. No acute vascular abnormality. No mass or adenopathy.

Peritoneal: No ascites or pneumoperitoneum.

Musculoskeletal: Negative.
IMPRESSION: No acute finding.  No hydronephrosis or urolithiasis.

## 2019-08-18 ENCOUNTER — Ambulatory Visit: Payer: Self-pay | Admitting: Internal Medicine
